# Patient Record
Sex: Male | Born: 1985 | Race: White | Hispanic: No | Smoking: Current every day smoker
Health system: Southern US, Community
[De-identification: ages and names within clinical notes are randomized; demographics above are authoritative.]

## PROBLEM LIST (undated history)

## (undated) DIAGNOSIS — J45909 Unspecified asthma, uncomplicated: Secondary | ICD-10-CM

## (undated) HISTORY — PX: OTHER SURGICAL HISTORY: SHX169

## (undated) HISTORY — PX: KNEE SURGERY: SHX244

---
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
## 2007-02-21 ENCOUNTER — Emergency Department (HOSPITAL_COMMUNITY): Admission: EM | Admit: 2007-02-21 | Discharge: 2007-02-21 | Payer: Self-pay | Admitting: Emergency Medicine

## 2014-02-08 ENCOUNTER — Emergency Department (HOSPITAL_COMMUNITY)
Admission: EM | Admit: 2014-02-08 | Discharge: 2014-02-08 | Disposition: A | Discharge status: Home / Self Care | Payer: BC Managed Care – PPO | Attending: Emergency Medicine | Admitting: Emergency Medicine

## 2014-02-08 ENCOUNTER — Encounter (HOSPITAL_COMMUNITY): Payer: Self-pay | Admitting: Emergency Medicine

## 2014-02-08 DIAGNOSIS — M545 Low back pain, unspecified: Secondary | ICD-10-CM | POA: Insufficient documentation

## 2014-02-08 DIAGNOSIS — R42 Dizziness and giddiness: Secondary | ICD-10-CM | POA: Insufficient documentation

## 2014-02-08 DIAGNOSIS — Z880 Allergy status to penicillin: Secondary | ICD-10-CM | POA: Insufficient documentation

## 2014-02-08 DIAGNOSIS — R1084 Generalized abdominal pain: Secondary | ICD-10-CM

## 2014-02-08 DIAGNOSIS — A084 Viral intestinal infection, unspecified: Secondary | ICD-10-CM

## 2014-02-08 DIAGNOSIS — A088 Other specified intestinal infections: Secondary | ICD-10-CM | POA: Diagnosis not present

## 2014-02-08 DIAGNOSIS — F17200 Nicotine dependence, unspecified, uncomplicated: Secondary | ICD-10-CM | POA: Insufficient documentation

## 2014-02-08 DIAGNOSIS — R000 Tachycardia, unspecified: Secondary | ICD-10-CM | POA: Diagnosis not present

## 2014-02-08 DIAGNOSIS — G8929 Other chronic pain: Secondary | ICD-10-CM | POA: Insufficient documentation

## 2014-02-08 DIAGNOSIS — R197 Diarrhea, unspecified: Secondary | ICD-10-CM

## 2014-02-08 DIAGNOSIS — Z79899 Other long term (current) drug therapy: Secondary | ICD-10-CM | POA: Diagnosis not present

## 2014-02-08 DIAGNOSIS — R112 Nausea with vomiting, unspecified: Secondary | ICD-10-CM | POA: Insufficient documentation

## 2014-02-08 LAB — CBC WITH DIFFERENTIAL/PLATELET
Basophils Absolute: 0 10*3/uL (ref 0.0–0.1)
Basophils Relative: 0 % (ref 0–1)
EOS ABS: 0.1 10*3/uL (ref 0.0–0.7)
Eosinophils Relative: 1 % (ref 0–5)
HCT: 47.9 % (ref 39.0–52.0)
HEMOGLOBIN: 16.6 g/dL (ref 13.0–17.0)
LYMPHS ABS: 1.7 10*3/uL (ref 0.7–4.0)
LYMPHS PCT: 21 % (ref 12–46)
MCH: 29.4 pg (ref 26.0–34.0)
MCHC: 34.7 g/dL (ref 30.0–36.0)
MCV: 84.8 fL (ref 78.0–100.0)
MONOS PCT: 9 % (ref 3–12)
Monocytes Absolute: 0.8 10*3/uL (ref 0.1–1.0)
NEUTROS ABS: 5.8 10*3/uL (ref 1.7–7.7)
NEUTROS PCT: 69 % (ref 43–77)
Platelets: 153 10*3/uL (ref 150–400)
RBC: 5.65 MIL/uL (ref 4.22–5.81)
RDW: 13.9 % (ref 11.5–15.5)
WBC: 8.3 10*3/uL (ref 4.0–10.5)

## 2014-02-08 LAB — URINALYSIS, ROUTINE W REFLEX MICROSCOPIC
BILIRUBIN URINE: NEGATIVE
Glucose, UA: NEGATIVE mg/dL
HGB URINE DIPSTICK: NEGATIVE
KETONES UR: NEGATIVE mg/dL
Leukocytes, UA: NEGATIVE
NITRITE: NEGATIVE
Protein, ur: NEGATIVE mg/dL
Specific Gravity, Urine: 1.021 (ref 1.005–1.030)
UROBILINOGEN UA: 1 mg/dL (ref 0.0–1.0)
pH: 6 (ref 5.0–8.0)

## 2014-02-08 LAB — COMPREHENSIVE METABOLIC PANEL
ALK PHOS: 53 U/L (ref 39–117)
ALT: 32 U/L (ref 0–53)
ANION GAP: 12 (ref 5–15)
AST: 16 U/L (ref 0–37)
Albumin: 3.7 g/dL (ref 3.5–5.2)
BILIRUBIN TOTAL: 1.5 mg/dL — AB (ref 0.3–1.2)
BUN: 11 mg/dL (ref 6–23)
CHLORIDE: 103 meq/L (ref 96–112)
CO2: 22 meq/L (ref 19–32)
Calcium: 8.6 mg/dL (ref 8.4–10.5)
Creatinine, Ser: 0.81 mg/dL (ref 0.50–1.35)
GLUCOSE: 96 mg/dL (ref 70–99)
POTASSIUM: 4 meq/L (ref 3.7–5.3)
Sodium: 137 mEq/L (ref 137–147)
Total Protein: 6.6 g/dL (ref 6.0–8.3)

## 2014-02-08 LAB — LIPASE, BLOOD: Lipase: 12 U/L (ref 11–59)

## 2014-02-08 MED ORDER — NAPROXEN 500 MG PO TABS: 500.0000 mg | ORAL_TABLET | Freq: Two times a day (BID) | ORAL | Status: DC | PRN

## 2014-02-08 MED ORDER — SODIUM CHLORIDE 0.9 % IV BOLUS (SEPSIS)
1000.0000 mL | Freq: Once | INTRAVENOUS | Status: AC
  Administered 2014-02-08: 1000 mL via INTRAVENOUS

## 2014-02-08 MED ORDER — MORPHINE SULFATE 4 MG/ML IJ SOLN
4.0000 mg | Freq: Once | INTRAMUSCULAR | Status: AC
  Administered 2014-02-08: 4 mg via INTRAVENOUS
  Filled 2014-02-08: qty 1

## 2014-02-08 MED ORDER — ONDANSETRON HCL 4 MG/2ML IJ SOLN
4.0000 mg | Freq: Once | INTRAMUSCULAR | Status: AC
  Administered 2014-02-08: 4 mg via INTRAVENOUS
  Filled 2014-02-08: qty 2

## 2014-02-08 MED ORDER — ONDANSETRON HCL 8 MG PO TABS: 8.0000 mg | ORAL_TABLET | Freq: Three times a day (TID) | ORAL | Status: AC | PRN

## 2014-02-08 MED ORDER — HYDROCODONE-ACETAMINOPHEN 5-325 MG PO TABS: 1.0000 | ORAL_TABLET | Freq: Four times a day (QID) | ORAL | Status: DC | PRN

## 2014-02-08 NOTE — ED Notes (Signed)
hes had n/v/d since last night. His wife and 28 year old were sick with GI virus this week

## 2014-02-08 NOTE — ED Provider Notes (Signed)
CSN: [id number]     Arrival date & time 02/08/14  1034 History   First MD Initiated Contact with Patient 02/08/14 1117     Chief Complaint  Patient presents with  . Emesis     (Consider location/radiation/quality/duration/timing/severity/associated sxs/prior Treatment) HPI Comments: Luis Rich is a 28 y.o. male with no known PMHx who presents to the ED with complaints of generalized and LUQ 6/10 crampy intermittent nonradiating abd pain and N/V/D x1 day. States his daughter and wife developed the same symptoms 2 days ago, and subsequently he developed them yesterday. States the pain was alleviated with heat and advil, and without known aggravating factors. Emesis is stomach contents and slightly bilious, but nonbloody. Diarrhea is watery/loose, unknown # of episodes, nonbloody and nonbilious. States he has an oral temp of 100.3 last night which resolved on its own PTA. Endorses associated myalgias and lightheadedness with standing, but denies any chills, HA, syncope, CP, SOB, cough, URI symptoms, diaphoresis, LAD, hematemesis, hematochezia, melena, obstipation, constipation, dysuria, hematuria, arthralgias, paresthesias, or weakness. Denies rash or color changes. No recent travel or illness, denies IV drug use. Endorses EtOH use occasionally, and everyday smoking. No prior abdominal surgeries  Patient is a 28 y.o. male presenting with abdominal pain. The history is provided by the patient. No language interpreter was used.  Abdominal Pain Pain location:  Generalized (and L upper quadrant) Pain quality: cramping   Pain radiates to:  Does not radiate Pain severity:  Moderate (6/10) Onset quality:  Gradual Duration:  1 day Timing:  Intermittent Progression:  Unchanged Chronicity:  New Context: sick contacts (daughter and wife at home with similar illness that began 2 days ago)   Context: not recent travel   Relieved by:  Heat and NSAIDs Worsened by:  Nothing tried Ineffective treatments:  None  tried Associated symptoms: diarrhea, nausea and vomiting   Associated symptoms: no anorexia, no chest pain, no chills, no constipation, no cough, no dysuria, no fever, no flatus, no hematemesis, no hematochezia, no hematuria, no melena and no shortness of breath   Diarrhea:    Quality:  Watery   Number of occurrences:  Unsure   Severity:  Moderate   Duration:  1 day   Timing:  Intermittent   Progression:  Unchanged Vomiting:    Quality:  Stomach contents   History reviewed. No pertinent past medical history. Past Surgical History  Procedure Laterality Date  . Knee surgery     History reviewed. No pertinent family history. History  Substance Use Topics  . Smoking status: Current Every Day Smoker  . Smokeless tobacco: Not on file  . Alcohol Use: Yes    Review of Systems  Constitutional: Negative for fever and chills.  Respiratory: Negative for cough and shortness of breath.   Cardiovascular: Negative for chest pain.  Gastrointestinal: Positive for nausea, vomiting, abdominal pain and diarrhea. Negative for constipation, blood in stool, melena, hematochezia, abdominal distention, rectal pain, anorexia, flatus and hematemesis.  Genitourinary: Negative for dysuria, urgency, frequency, hematuria, flank pain, decreased urine volume, discharge, penile swelling, scrotal swelling, difficulty urinating, penile pain and testicular pain.  Musculoskeletal: Positive for back pain (chronic and ongoing x2 months after trampoline injury, unchanged) and myalgias. Negative for joint swelling, neck pain and neck stiffness.  Skin: Negative for color change.  Neurological: Positive for light-headedness (with standing yesterday, now resolved). Negative for dizziness, syncope and weakness.  Hematological: Negative for adenopathy.  10 Systems reviewed and are negative for acute change except as noted in the HPI.  Allergies  Amoxicillin and Penicillins  Home Medications   Prior to Admission  medications   Medication Sig Start Date End Date Taking? Authorizing Provider  albuterol (PROVENTIL HFA;VENTOLIN HFA) 108 (90 BASE) MCG/ACT inhaler Inhale 2 puffs into the lungs every 6 (six) hours as needed for wheezing or shortness of breath.   Yes Historical Provider, MD  ibuprofen (ADVIL,MOTRIN) 200 MG tablet Take 600 mg by mouth every 6 (six) hours as needed for moderate pain.   Yes Historical Provider, MD  HYDROcodone-acetaminophen (NORCO) 5-325 MG per tablet Take 1-2 tablets by mouth every 6 (six) hours as needed for severe pain. 02/08/14   Yianna Tersigni Strupp Camprubi-Soms, PA-C  naproxen (NAPROSYN) 500 MG tablet Take 1 tablet (500 mg total) by mouth 2 (two) times daily as needed for mild pain, moderate pain or headache (TAKE WITH MEALS.). 02/08/14   Donnita Falls Camprubi-Soms, PA-C  ondansetron (ZOFRAN) 8 MG tablet Take 1 tablet (8 mg total) by mouth every 8 (eight) hours as needed for nausea or vomiting. 02/08/14   Psalm Arman Strupp Camprubi-Soms, PA-C   BP 159/92  Pulse 115  Temp(Src) 98.8 F (37.1 C) (Oral)  Resp 18  Ht  (1.727 m)  Wt 250 lb (113.399 kg)  BMI 38.02 kg/m2  SpO2 97% Physical Exam  Nursing note and vitals reviewed. Constitutional: He is oriented to person, place, and time. He appears well-developed and well-nourished.  Non-toxic appearance. No distress.  Afebrile, nontoxic, appears uncomfortable and rubbing his abdomen. Initially tachycardic  HENT:  Head: Normocephalic and atraumatic.  Mouth/Throat: Mucous membranes are dry.  Dry mucous membranes  Eyes: Conjunctivae and EOM are normal. Right eye exhibits no discharge. Left eye exhibits no discharge.  Neck: Normal range of motion. Neck supple. No spinous process tenderness and no muscular tenderness present. No rigidity. Normal range of motion present.  FROM intact without spinous process or paraspinous muscle TTP, no bony stepoffs or deformities, no muscle spasms. No rigidity or meningeal signs. No bruising or  swelling.  Cardiovascular: Regular rhythm, normal heart sounds and intact distal pulses.  Tachycardia present.  Exam reveals no gallop and no friction rub.   No murmur heard. Initially tachycardic  Pulmonary/Chest: Effort normal and breath sounds normal. No respiratory distress. He has no decreased breath sounds. He has no wheezes. He has no rhonchi. He has no rales.   He exhibits tenderness.  L posterior chest wall at rib cage margin mildly TTP  Abdominal: Soft. Normal appearance and bowel sounds are normal. He exhibits no distension. There is tenderness in the left upper quadrant. There is no rigidity, no rebound, no guarding, no CVA tenderness, no tenderness at McBurney's point and negative Murphy's sign.  Soft, nondistended, +BS throughout, with mild TTP in LUQ without r/g/r or peritoneal signs, neg Murphy's, neg McBurney's. No CVA TTP, although L posterior rib cage edge mildly TTP  Musculoskeletal: Normal range of motion.  MAE x4, gait WNL  Neurological: He is alert and oriented to person, place, and time.  Skin: Skin is warm, dry and intact. No rash noted. No pallor.  Psychiatric: He has a normal mood and affect.    ED Course  Procedures (including critical care time) Labs Review Labs Reviewed  COMPREHENSIVE METABOLIC PANEL - Abnormal; Notable for the following:    Total Bilirubin 1.5 (*)    All other components within normal limits  CBC WITH DIFFERENTIAL  URINALYSIS, ROUTINE W REFLEX MICROSCOPIC  LIPASE, BLOOD    Imaging Review No results found.   EKG Interpretation  None      MDM   Final diagnoses:  Nausea vomiting and diarrhea  Viral gastroenteritis  Generalized abdominal cramping    27y/o male with GI upset after wife and daughter became ill with similar symptoms, began 1 day ago. Tachycardic but afebrile, appears dry, given 1 bolus of fluids with resolution of tachycardia. Will await labs, U/A, and give 1 more bolus of fluids, as well as morphine and zofran.  Will reassess after labs result.  2:30 PM Pt just now providing urine. Pt feeling greatly improved after 2 boluses, zofran, and morphine. CBC WNL, CMP showing mildly elevated bili at 1.5 but otherwise WNL, doubt any hepatic or biliary tract dysfunction. Doubt need for CT, but will assess after urine, given that he's had vague L CVA pain x2 months, which hasn't changed, but could potentially be related. Will reassess after urine results  4:39 PM U/A unremarkable. L CVA pain likely musculoskeletal, which has become aggravating with vomiting. Pt tolerating PO here, feels improved, I believe this is a viral illness given pt stable and afebrile. Tachycardia resolved after 2 boluses, and VSS during stay. Doubt any other emergent condition requiring further work up. Will give rx for zofran, norco, and naprosyn with f/up at PCP in 1 wk. Discussed rest, hydration, and BRAT diet. I explained the diagnosis and have given explicit precautions to return to the ER including for any other new or worsening symptoms. The patient understands and accepts the medical plan as it's been dictated and I have answered their questions. Discharge instructions concerning home care and prescriptions have been given. The patient is STABLE and is discharged to home in good condition.  BP 132/74  Pulse 82  Temp(Src) 98.8 F (37.1 C) (Oral)  Resp 18  Ht  (1.727 m)  Wt 250 lb (113.399 kg)  BMI 38.02 kg/m2  SpO2 97%  Meds ordered this encounter  Medications  . sodium chloride 0.9 % bolus 1,000 mL    Sig:   . morphine 4 MG/ML injection 4 mg    Sig:   . ondansetron (ZOFRAN) injection 4 mg    Sig:   . sodium chloride 0.9 % bolus 1,000 mL    Sig:   . HYDROcodone-acetaminophen (NORCO) 5-325 MG per tablet    Sig: Take 1-2 tablets by mouth every 6 (six) hours as needed for severe pain.    Dispense:  10 tablet    Refill:  0    Order Specific Question:  Supervising Provider    Answer:  Eber Hong D [[id number]]  . naproxen  (NAPROSYN) 500 MG tablet    Sig: Take 1 tablet (500 mg total) by mouth 2 (two) times daily as needed for mild pain, moderate pain or headache (TAKE WITH MEALS.).    Dispense:  20 tablet    Refill:  0    Order Specific Question:  Supervising Provider    Answer:  Eber Hong D [[id number]]  . ondansetron (ZOFRAN) 8 MG tablet    Sig: Take 1 tablet (8 mg total) by mouth every 8 (eight) hours as needed for nausea or vomiting.    Dispense:  20 tablet    Refill:  0    Order Specific Question:  Supervising Provider    Answer:  Vida Roller [address] Camprubi-Soms, PA-C 02/08/14 1642

## 2014-02-08 NOTE — ED Notes (Signed)
Pt placed in room and placed in gown and on cardiac monitor.

## 2014-02-08 NOTE — Discharge Instructions (Signed)
Use zofran as prescribed, as needed for nausea. Use naprosyn and norco as needed for pain but don't drive or operate machinery while using norco, and always take naprosyn with food. Stay well hydrated with small sips of fluids (water/gatorade) throughout the day. Follow a BRAT (banana-rice-applesauce-toast) diet as described below for the next 24-48 hours. The 'BRAT' diet is suggested, then progress to diet as tolerated as symptoms abate. Call if bloody stools, persistent diarrhea, vomiting, fever or abdominal pain. Return to ER for changing or worsening of symptoms.  Food Choices to Help Relieve Diarrhea When you have diarrhea, the foods you eat and your eating habits are very important. Choosing the right foods and drinks can help relieve diarrhea. Also, because diarrhea can last up to 7 days, you need to replace lost fluids and electrolytes (such as sodium, potassium, and chloride) in order to help prevent dehydration.  WHAT GENERAL GUIDELINES DO I NEED TO FOLLOW?  Slowly drink 1 cup (8 oz) of fluid for each episode of diarrhea. If you are getting enough fluid, your urine will be clear or pale yellow.  Eat starchy foods. Some good choices include white rice, white toast, pasta, low-fiber cereal, baked potatoes (without the skin), saltine crackers, and bagels.  Avoid large servings of any cooked vegetables.  Limit fruit to two servings per day. A serving is  cup or 1 small piece.  Choose foods with less than 2 g of fiber per serving.  Limit fats to less than 8 tsp (38 g) per day.  Avoid fried foods.  Eat foods that have probiotics in them. Probiotics can be found in certain dairy products.  Avoid foods and beverages that may increase the speed at which food moves through the stomach and intestines (gastrointestinal tract). Things to avoid include:  High-fiber foods, such as dried fruit, raw fruits and vegetables, nuts, seeds, and whole grain foods.  Spicy foods and high-fat  foods.  Foods and beverages sweetened with high-fructose corn syrup, honey, or sugar alcohols such as xylitol, sorbitol, and mannitol. WHAT FOODS ARE RECOMMENDED? Grains White rice. White, [place], or pita breads (fresh or toasted), including plain rolls, buns, or bagels. White pasta. Saltine, soda, or graham crackers. Pretzels. Low-fiber cereal. Cooked cereals made with water (such as cornmeal, farina, or cream cereals). Plain muffins. Matzo. Melba toast. Zwieback.  Vegetables Potatoes (without the skin). Strained tomato and vegetable juices. Most well-cooked and canned vegetables without seeds. Tender lettuce. Fruits Cooked or canned applesauce, apricots, cherries, fruit cocktail, grapefruit, peaches, pears, or plums. Fresh bananas, apples without skin, cherries, grapes, cantaloupe, grapefruit, peaches, oranges, or plums.  Meat and Other Protein Products Baked or boiled chicken. Eggs. Tofu. Fish. Seafood. Smooth peanut butter. Ground or well-cooked tender beef, ham, veal, lamb, pork, or poultry.  Dairy Plain yogurt, kefir, and unsweetened liquid yogurt. Lactose-free milk, buttermilk, or soy milk. Plain hard cheese. Beverages Sport drinks. Clear broths. Diluted fruit juices (except prune). Regular, caffeine-free sodas such as ginger ale. Water. Decaffeinated teas. Oral rehydration solutions. Sugar-free beverages not sweetened with sugar alcohols. Other Bouillon, broth, or soups made from recommended foods.  The items listed above may not be a complete list of recommended foods or beverages. Contact your dietitian for more options. WHAT FOODS ARE NOT RECOMMENDED? Grains Whole grain, whole wheat, bran, or rye breads, rolls, pastas, crackers, and cereals. Wild or brown rice. Cereals that contain more than 2 g of fiber per serving. Corn tortillas or taco shells. Cooked or dry oatmeal. Granola. Popcorn. Vegetables Raw vegetables.  Cabbage, broccoli, Brussels sprouts, artichokes, baked beans, beet  greens, corn, kale, legumes, peas, sweet potatoes, and yams. Potato skins. Cooked spinach and cabbage. Fruits Dried fruit, including raisins and dates. Raw fruits. Stewed or dried prunes. Fresh apples with skin, apricots, mangoes, pears, raspberries, and strawberries.  Meat and Other Protein Products Chunky peanut butter. Nuts and seeds. Beans and lentils. Berniece Salines.  Dairy High-fat cheeses. Milk, chocolate milk, and beverages made with milk, such as milk shakes. Cream. Ice cream. Sweets and Desserts Sweet rolls, doughnuts, and sweet breads. Pancakes and waffles. Fats and Oils Butter. Cream sauces. Margarine. Salad oils. Plain salad dressings. Olives. Avocados.  Beverages Caffeinated beverages (such as coffee, tea, soda, or energy drinks). Alcoholic beverages. Fruit juices with pulp. Prune juice. Soft drinks sweetened with high-fructose corn syrup or sugar alcohols. Other Coconut. Hot sauce. Chili powder. Mayonnaise. Gravy. Cream-based or milk-based soups.  The items listed above may not be a complete list of foods and beverages to avoid. Contact your dietitian for more information. WHAT SHOULD I DO IF I BECOME DEHYDRATED? Diarrhea can sometimes lead to dehydration. Signs of dehydration include dark urine and dry mouth and skin. If you think you are dehydrated, you should rehydrate with an oral rehydration solution. These solutions can be purchased at pharmacies, retail stores, or online.  Drink -1 cup (120-240 mL) of oral rehydration solution each time you have an episode of diarrhea. If drinking this amount makes your diarrhea worse, try drinking smaller amounts more often. For example, drink 1-3 tsp (5-15 mL) every 5-10 minutes.  A general rule for staying hydrated is to drink 1-2 L of fluid per day. Talk to your health care provider about the specific amount you should be drinking each day. Drink enough fluids to keep your urine clear or pale yellow. Document Released: 08/13/2003 Document  Revised: 05/28/2013 Document Reviewed: 04/15/2013 [facility] Patient Information 2015 [place], [place]. This information is not intended to replace advice given to you by your health care provider. Make sure you discuss any questions you have with your health care provider.   Abdominal Pain Many things can cause belly (abdominal) pain. Most times, the belly pain is not dangerous. Many cases of belly pain can be watched and treated at home. HOME CARE   Do not take medicines that help you go poop (laxatives) unless told to by your doctor.  Only take medicine as told by your doctor.  Eat or drink as told by your doctor. Your doctor will tell you if you should be on a special diet. GET HELP IF:  You do not know what is causing your belly pain.  You have belly pain while you are sick to your stomach (nauseous) or have runny poop (diarrhea).  You have pain while you pee or poop.  Your belly pain wakes you up at night.  You have belly pain that gets worse or better when you eat.  You have belly pain that gets worse when you eat fatty foods.  You have a fever. GET HELP RIGHT AWAY IF:   The pain does not go away within 2 hours.  You keep throwing up (vomiting).  The pain changes and is only in the right or left part of the belly.  You have bloody or tarry looking poop. MAKE SURE YOU:   Understand these instructions.  Will watch your condition.  Will get help right away if you are not doing well or get worse. Document Released: 11/09/2007 Document Revised: 05/28/2013 Document Reviewed: 01/30/2013 [employer]  Patient Information 2015 [place], [place]. This information is not intended to replace advice given to you by your health care provider. Make sure you discuss any questions you have with your health care provider.  Diarrhea Diarrhea is watery poop (stool). It can make you feel weak, tired, thirsty, or give you a dry mouth (signs of dehydration). Watery poop is a sign of another  problem, most often an infection. It often lasts 2-3 days. It can last longer if it is a sign of something serious. Take care of yourself as told by your doctor. HOME CARE   Drink 1 cup (8 ounces) of fluid each time you have watery poop.  Do not drink the following fluids:  Those that contain simple sugars (fructose, glucose, galactose, lactose, sucrose, maltose).  Sports drinks.  Fruit juices.  Whole milk products.  Sodas.  Drinks with caffeine (coffee, tea, soda) or alcohol.  Oral rehydration solution may be used if the doctor says it is okay. You may make your own solution. Follow this recipe:   - teaspoon table salt.   teaspoon baking soda.   teaspoon salt substitute containing potassium chloride.  1 tablespoons sugar.  1 liter (34 ounces) of water.  Avoid the following foods:  High fiber foods, such as raw fruits and vegetables.  Nuts, seeds, and whole grain breads and cereals.   Those that are sweetened with sugar alcohols (xylitol, sorbitol, mannitol).  Try eating the following foods:  Starchy foods, such as rice, toast, pasta, low-sugar cereal, oatmeal, baked potatoes, crackers, and bagels.  Bananas.  Applesauce.  Eat probiotic-rich foods, such as yogurt and milk products that are fermented.  Wash your hands well after each time you have watery poop.  Only take medicine as told by your doctor.  Take a warm bath to help lessen burning or pain from having watery poop. GET HELP RIGHT AWAY IF:   You cannot drink fluids without throwing up (vomiting).  You keep throwing up.  You have blood in your poop, or your poop looks black and tarry.  You do not pee (urinate) in 6-8 hours, or there is only a small amount of very dark pee.  You have belly (abdominal) pain that gets worse or stays in the same spot (localizes).  You are weak, dizzy, confused, or light-headed.  You have a very bad headache.  Your watery poop gets worse or does not get  better.  You have a fever or lasting symptoms for more than 2-3 days.  You have a fever and your symptoms suddenly get worse. MAKE SURE YOU:   Understand these instructions.  Will watch your condition.  Will get help right away if you are not doing well or get worse. Document Released: 11/09/2007 Document Revised: 10/07/2013 Document Reviewed: 01/29/2012 [facility] Patient Information 2015 [place], [place]. This information is not intended to replace advice given to you by your health care provider. Make sure you discuss any questions you have with your health care provider.  Nausea and Vomiting Nausea means you feel sick to your stomach. Throwing up (vomiting) is a reflex where stomach contents come out of your mouth. HOME CARE   Take medicine as told by your doctor.  Do not force yourself to eat. However, you do need to drink fluids.  If you feel like eating, eat a normal diet as told by your doctor.  Eat rice, wheat, potatoes, bread, lean meats, yogurt, fruits, and vegetables.  Avoid high-fat foods.  Drink enough fluids to keep your  pee (urine) clear or pale yellow.  Ask your doctor how to replace body fluid losses (rehydrate). Signs of body fluid loss (dehydration) include:  Feeling very thirsty.  Dry lips and mouth.  Feeling dizzy.  Dark pee.  Peeing less than normal.  Feeling confused.  Fast breathing or heart rate. GET HELP RIGHT AWAY IF:   You have blood in your throw up.  You have black or bloody poop (stool).  You have a bad headache or stiff neck.  You feel confused.  You have bad belly (abdominal) pain.  You have chest pain or trouble breathing.  You do not pee at least once every 8 hours.  You have cold, clammy skin.  You keep throwing up after 24 to 48 hours.  You have a fever. MAKE SURE YOU:   Understand these instructions.  Will watch your condition.  Will get help right away if you are not doing well or get worse. Document  Released: 11/09/2007 Document Revised: 08/15/2011 Document Reviewed: 10/22/2010 [facility] Patient Information 2015 [place], [place]. This information is not intended to replace advice given to you by your health care provider. Make sure you discuss any questions you have with your health care provider.  Viral Gastroenteritis Viral gastroenteritis is also called stomach flu. This illness is caused by a certain type of germ (virus). It can cause sudden watery poop (diarrhea) and throwing up (vomiting). This can cause you to lose body fluids (dehydration). This illness usually lasts for 3 to 8 days. It usually goes away on its own. HOME CARE   Drink enough fluids to keep your pee (urine) clear or pale yellow. Drink small amounts of fluids often.  Ask your doctor how to replace body fluid losses (rehydration).  Avoid:  Foods high in sugar.  Alcohol.  Bubbly (carbonated) drinks.  Tobacco.  Juice.  Caffeine drinks.  Very hot or cold fluids.  Fatty, greasy foods.  Eating too much at one time.  Dairy products until 24 to 48 hours after your watery poop stops.  You may eat foods with active cultures (probiotics). They can be found in some yogurts and supplements.  Wash your hands well to avoid spreading the illness.  Only take medicines as told by your doctor. Do not give aspirin to children. Do not take medicines for watery poop (antidiarrheals).  Ask your doctor if you should keep taking your regular medicines.  Keep all doctor visits as told. GET HELP RIGHT AWAY IF:   You cannot keep fluids down.  You do not pee at least once every 6 to 8 hours.  You are short of breath.  You see blood in your poop or throw up. This may look like coffee grounds.  You have belly (abdominal) pain that gets worse or is just in one small spot (localized).  You keep throwing up or having watery poop.  You have a fever.  The patient is a child younger than 3 months, and he or she has a  fever.  The patient is a child older than 3 months, and he or she has a fever and problems that do not go away.  The patient is a child older than 3 months, and he or she has a fever and problems that suddenly get worse.  The patient is a baby, and he or she has no tears when crying. MAKE SURE YOU:   Understand these instructions.  Will watch your condition.  Will get help right away if you are not doing  well or get worse. Document Released: 11/09/2007 Document Revised: 08/15/2011 Document Reviewed: 03/09/2011 [facility] Patient Information 2015 [place], [place]. This information is not intended to replace advice given to you by your health care provider. Make sure you discuss any questions you have with your health care provider.

## 2014-02-08 NOTE — ED Notes (Signed)
Pt states he is feeling better. The cramps went away but his lower back is hurting from sitting on the stretcher.

## 2014-02-11 NOTE — ED Provider Notes (Signed)
Medical screening examination/treatment/procedure(s) were performed by non-physician practitioner and as supervising physician I was immediately available for consultation/collaboration.   EKG Interpretation None       Arby Barrette, MD 02/11/14 1112

## 2015-06-09 ENCOUNTER — Emergency Department (HOSPITAL_COMMUNITY)
Admission: EM | Admit: 2015-06-09 | Discharge: 2015-06-09 | Disposition: A | Discharge status: Home / Self Care | Payer: 59 | Attending: Emergency Medicine | Admitting: Emergency Medicine

## 2015-06-09 ENCOUNTER — Encounter (HOSPITAL_COMMUNITY): Payer: Self-pay | Admitting: Emergency Medicine

## 2015-06-09 ENCOUNTER — Emergency Department (HOSPITAL_COMMUNITY): Payer: 59

## 2015-06-09 DIAGNOSIS — F17210 Nicotine dependence, cigarettes, uncomplicated: Secondary | ICD-10-CM | POA: Insufficient documentation

## 2015-06-09 DIAGNOSIS — Y998 Other external cause status: Secondary | ICD-10-CM | POA: Diagnosis not present

## 2015-06-09 DIAGNOSIS — X58XXXA Exposure to other specified factors, initial encounter: Secondary | ICD-10-CM | POA: Diagnosis not present

## 2015-06-09 DIAGNOSIS — G8929 Other chronic pain: Secondary | ICD-10-CM | POA: Diagnosis not present

## 2015-06-09 DIAGNOSIS — M545 Low back pain, unspecified: Secondary | ICD-10-CM

## 2015-06-09 DIAGNOSIS — S3992XA Unspecified injury of lower back, initial encounter: Secondary | ICD-10-CM | POA: Insufficient documentation

## 2015-06-09 DIAGNOSIS — Z79899 Other long term (current) drug therapy: Secondary | ICD-10-CM | POA: Diagnosis not present

## 2015-06-09 DIAGNOSIS — Y9289 Other specified places as the place of occurrence of the external cause: Secondary | ICD-10-CM | POA: Diagnosis not present

## 2015-06-09 DIAGNOSIS — Z880 Allergy status to penicillin: Secondary | ICD-10-CM | POA: Diagnosis not present

## 2015-06-09 DIAGNOSIS — Y9372 Activity, wrestling: Secondary | ICD-10-CM | POA: Diagnosis not present

## 2015-06-09 LAB — URINALYSIS, ROUTINE W REFLEX MICROSCOPIC
Bilirubin Urine: NEGATIVE
GLUCOSE, UA: NEGATIVE mg/dL
HGB URINE DIPSTICK: NEGATIVE
Ketones, ur: NEGATIVE mg/dL
Leukocytes, UA: NEGATIVE
NITRITE: NEGATIVE
PH: 5 (ref 5.0–8.0)
PROTEIN: NEGATIVE mg/dL
SPECIFIC GRAVITY, URINE: 1.018 (ref 1.005–1.030)

## 2015-06-09 MED ORDER — OXYCODONE-ACETAMINOPHEN 5-325 MG PO TABS: 1.0000 | ORAL_TABLET | Freq: Three times a day (TID) | ORAL | Status: AC | PRN

## 2015-06-09 MED ORDER — KETOROLAC TROMETHAMINE 60 MG/2ML IM SOLN
60.0000 mg | Freq: Once | INTRAMUSCULAR | Status: AC
  Administered 2015-06-09: 60 mg via INTRAMUSCULAR
  Filled 2015-06-09: qty 2

## 2015-06-09 MED ORDER — PREDNISONE 20 MG PO TABS
60.0000 mg | ORAL_TABLET | Freq: Once | ORAL | Status: AC
  Administered 2015-06-09: 60 mg via ORAL
  Filled 2015-06-09: qty 3

## 2015-06-09 MED ORDER — OXYCODONE-ACETAMINOPHEN 5-325 MG PO TABS
2.0000 | ORAL_TABLET | Freq: Once | ORAL | Status: AC
  Administered 2015-06-09: 2 via ORAL
  Filled 2015-06-09: qty 2

## 2015-06-09 MED ORDER — MORPHINE SULFATE (PF) 4 MG/ML IV SOLN
4.0000 mg | Freq: Once | INTRAVENOUS | Status: AC
  Administered 2015-06-09: 4 mg via INTRAMUSCULAR
  Filled 2015-06-09: qty 1

## 2015-06-09 MED ORDER — IBUPROFEN 600 MG PO TABS: 600.0000 mg | ORAL_TABLET | Freq: Four times a day (QID) | ORAL | Status: AC | PRN

## 2015-06-09 NOTE — Discharge Instructions (Signed)
You were evaluated in the ED today for your back pain. There does not appear to be an emergent cause for your symptoms at this time. Your exam was reassuring. 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 You may take your Motrin for mild to moderate pain in the oxycodone for severe/breakthrough pain. Do not take this medication before driving as a can make you drowsy. Follow-up with your doctor or neurology for further evaluation and management of your symptoms. Return to ED for any new or worsening symptoms.  Back Pain, Adult Back pain is very common in adults.The cause of back pain is rarely dangerous and the pain often gets better over time.The cause of your back pain may not be known. Some common causes of back pain include:  Strain of the muscles or ligaments supporting the spine.  Wear and tear (degeneration) of the spinal disks.  Arthritis.  Direct injury to the back. For many people, back pain may return. Since back pain is rarely dangerous, most people can learn to manage this condition on their own. HOME CARE INSTRUCTIONS Watch your back pain for any changes. The following actions may help to lessen any discomfort you are feeling:  Remain active. It is stressful on your back to sit or stand in one place for long periods of time. Do not sit, drive, or stand in one place for more than 30 minutes at a time. Take short walks on even surfaces as soon as you are able.Try to increase the length of time you walk each day.  Exercise regularly as directed by your health care provider. Exercise helps your back heal faster. It also helps avoid future injury by keeping your muscles strong and flexible.  Do not stay in bed.Resting more than 1-2 days can delay your recovery.  Pay attention to your body when you bend and lift. The most comfortable positions are those that put less stress on your recovering back. Always use proper lifting techniques,  including:  Bending your knees.  Keeping the load close to your body.  Avoiding twisting.  Find a comfortable position to sleep. Use a firm mattress and lie on your side with your knees slightly bent. If you lie on your back, put a pillow under your knees.  Avoid feeling anxious or stressed.Stress increases muscle tension and can worsen back pain.It is important to recognize when you are anxious or stressed and learn ways to manage it, such as with exercise.  Take medicines only as directed by your health care provider. Over-the-counter medicines to reduce pain and inflammation are often the most helpful.Your health care provider may prescribe muscle relaxant drugs.These medicines help dull your pain so you can more quickly return to your normal activities and healthy exercise.  Apply ice to the injured area:  Put ice in a plastic bag.  Place a towel between your skin and the bag.  Leave the ice on for 20 minutes, 2-3 times a day for the first 2-3 days. After that, ice and heat may be alternated to reduce pain and spasms.  Maintain a healthy weight. Excess weight puts extra stress on your back and makes it difficult to maintain good posture. SEEK MEDICAL CARE IF:  You have pain that is not relieved with rest or medicine.  You have increasing pain going down into the legs or buttocks.  You have pain that does not improve in one week.  You have night pain.  You lose weight.  You have a fever or chills. SEEK IMMEDIATE MEDICAL CARE IF:   You develop new bowel or bladder control problems.  You have unusual weakness or numbness in your arms or legs.  You develop nausea or vomiting.  You develop abdominal pain.  You feel faint.   This information is not intended to replace advice given to you by your health care provider. Make sure you discuss any questions you have with your health care provider.   Document Released: 05/23/2005 Document Revised: 06/13/2014 Document  Reviewed: 09/24/2013 [employer] Interactive Patient Education [employer].

## 2015-06-09 NOTE — ED Provider Notes (Signed)
CSN: [id number]     Arrival date & time 06/09/15  0900 History   First MD Initiated Contact with Patient 06/09/15 [phone]     Chief Complaint  Patient presents with  . Back Pain     (Consider location/radiation/quality/duration/timing/severity/associated sxs/prior Treatment) HPI Luis Rich is a 30 y.o. male who is otherwise healthy comes in for evaluation of back pain. Patient reports he has had low back pain over the past 4 months or so after an injury on a trampoline. 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 He reports associated numbness in his left leg. He also reports difficulties urinating and when he is able to urinate it is very painful and described as "sharp". He also reports painful bowel movements, nonbloody or overtly dark. Also reports bilateral, dull testicular pain. No penile discharge, concern for STI. Reports monogamous relationship. Nothing immediately tried for current back pain. Movement worsens the discomfort, rest makes it tolerable. Pain is moderate in ED. No other modifying factors.  History reviewed. No pertinent past medical history. Past Surgical History  Procedure Laterality Date  . Knee surgery    . Chronic back pain     No family history on file. Social History  Substance Use Topics  . Smoking status: Current Every Day Smoker -- 0.50 packs/day    Types: Cigarettes  . Smokeless tobacco: None  . Alcohol Use: Yes     Comment: socially    Review of Systems A 10 point review of systems was completed and was negative except for pertinent positives and negatives as mentioned in the history of present illness     Allergies  Amoxicillin and Penicillins  Home Medications   Prior to Admission medications   Medication Sig Start Date End Date Taking? Authorizing Provider  HYDROcodone-acetaminophen (NORCO) 5-325 MG per  tablet Take 1-2 tablets by mouth every 6 (six) hours as needed for severe pain. Patient not taking: Reported on 06/09/2015 02/08/14   Mercedes Camprubi-Soms, PA-C  ibuprofen (ADVIL,MOTRIN) 600 MG tablet Take 1 tablet (600 mg total) by mouth every 6 (six) hours as needed. 06/09/15   Joycie Peek, PA-C  naproxen (NAPROSYN) 500 MG tablet Take 1 tablet (500 mg total) by mouth 2 (two) times daily as needed for mild pain, moderate pain or headache (TAKE WITH MEALS.). Patient not taking: Reported on 06/09/2015 02/08/14   Mercedes Camprubi-Soms, PA-C  ondansetron (ZOFRAN) 8 MG tablet Take 1 tablet (8 mg total) by mouth every 8 (eight) hours as needed for nausea or vomiting. Patient not taking: Reported on 06/09/2015 02/08/14   Mercedes Camprubi-Soms, PA-C  oxyCODONE-acetaminophen (PERCOCET/ROXICET) 5-325 MG tablet Take 1-2 tablets by mouth every 8 (eight) hours as needed for severe pain. 06/09/15   Divante Kotch, PA-C   BP 144/90 mmHg  Pulse 92  Temp(Src) 98.5 F (36.9 C) (Oral)  Resp 20  SpO2 100% Physical Exam  Constitutional: He is oriented to person, place, and time. He appears well-developed and well-nourished.  HENT:  Head: Normocephalic and atraumatic.  Mouth/Throat: Oropharynx is clear and moist.  Eyes: Conjunctivae are normal. Pupils are equal, round, and reactive to light. Right eye exhibits no discharge. Left eye exhibits no discharge. No scleral icterus.  Neck: Neck supple.  Cardiovascular: Normal rate, regular rhythm and normal heart sounds.   Pulmonary/Chest: Effort normal and breath sounds normal. No respiratory distress. He has no wheezes.  He has no rales.  Abdominal: Soft. There is no tenderness.  Genitourinary:  Unremarkable male genital exam. No testicular or epididymal tenderness. No scrotal swelling, erythema. No inguinal lymphadenopathy. No other lesions or deformities.  Musculoskeletal: He exhibits no tenderness.  Neurological: He is alert and oriented to person, place, and time.   Cranial Nerves II-XII grossly intact. Moves all extremities without ataxia. Motor strength appears to be intact, although slightly weaker on left leg due to pain. Sensation is intact to light touch.  Skin: Skin is warm and dry. No rash noted.  Psychiatric: He has a normal mood and affect.  Nursing note and vitals reviewed.   ED Course  Procedures (including critical care time) Labs Review Labs Reviewed  URINALYSIS, ROUTINE W REFLEX MICROSCOPIC (NOT AT [facility])  GC/CHLAMYDIA PROBE AMP ([place]) NOT AT [facility]    Imaging Review Mr Thoracic Spine Wo Contrast  06/09/2015  CLINICAL DATA:  Low back pain EXAM: MRI THORACIC AND LUMBAR SPINE WITHOUT CONTRAST TECHNIQUE: Multiplanar and multiecho pulse sequences of the thoracic and lumbar spine were obtained without intravenous contrast. COMPARISON:  None. FINDINGS: MR THORACIC SPINE FINDINGS Image quality degraded by mild motion Normal thoracic alignment.  Negative for fracture or mass. Negative for cord compression. No cord lesion identified. Suboptimal evaluation of the cord due to motion. Disc degeneration Schmorl's nodes from T8 through L1. Multilevel mild facet degeneration throughout the thoracic spine. Significant disc pathology is described below. Small central disc protrusion at T5-6 with mild facet degeneration. Mild spinal stenosis. Small disc protrusion T7-8 with upgoing disc material behind the T7 vertebral body. Mild facet degeneration and mild spinal stenosis. No cord deformity. No other disc protrusion or significant spinal stenosis. MR LUMBAR SPINE FINDINGS Image quality degraded by mild motion. Normal alignment. Negative for fracture or mass lesion. Conus medullaris normal and terminates at L1. L1-2:  Negative L2-3:  Negative L3-4:  Negative L4-5: Moderately large left paracentral disc protrusion with flattening of the thecal sac and moderate spinal stenosis. Mild facet hypertrophy bilaterally. Neural foramina widely patent L5-S1: Small  central disc protrusion and osteophyte without significant stenosis or neural impingement. IMPRESSION: MR THORACIC SPINE IMPRESSION Multilevel disc and facet degeneration throughout the thoracic spine. Small disc protrusions at T5-6 and T7-8 with mild spinal stenosis MR LUMBAR SPINE IMPRESSION Moderately large left paracentral disc protrusion at L4-5 causing moderate spinal stenosis. Small central disc protrusion and osteophyte L5-S1 without neural impingement. Electronically Signed   By: Marlan Palau M.D.   On: 06/09/2015 13:24   Mr Lumbar Spine Wo Contrast  06/09/2015  CLINICAL DATA:  Low back pain EXAM: MRI THORACIC AND LUMBAR SPINE WITHOUT CONTRAST TECHNIQUE: Multiplanar and multiecho pulse sequences of the thoracic and lumbar spine were obtained without intravenous contrast. COMPARISON:  None. FINDINGS: MR THORACIC SPINE FINDINGS Image quality degraded by mild motion Normal thoracic alignment.  Negative for fracture or mass. Negative for cord compression. No cord lesion identified. Suboptimal evaluation of the cord due to motion. Disc degeneration Schmorl's nodes from T8 through L1. Multilevel mild facet degeneration throughout the thoracic spine. Significant disc pathology is described below. Small central disc protrusion at T5-6 with mild facet degeneration. Mild spinal stenosis. Small disc protrusion T7-8 with upgoing disc material behind the T7 vertebral body. Mild facet degeneration and mild spinal stenosis. No cord deformity. No other disc protrusion or significant spinal stenosis. MR LUMBAR SPINE FINDINGS Image quality degraded by mild motion. Normal alignment. Negative for fracture or mass lesion. Conus medullaris normal and terminates at  L1. L1-2:  Negative L2-3:  Negative L3-4:  Negative L4-5: Moderately large left paracentral disc protrusion with flattening of the thecal sac and moderate spinal stenosis. Mild facet hypertrophy bilaterally. Neural foramina widely patent L5-S1: Small central disc  protrusion and osteophyte without significant stenosis or neural impingement. IMPRESSION: MR THORACIC SPINE IMPRESSION Multilevel disc and facet degeneration throughout the thoracic spine. Small disc protrusions at T5-6 and T7-8 with mild spinal stenosis MR LUMBAR SPINE IMPRESSION Moderately large left paracentral disc protrusion at L4-5 causing moderate spinal stenosis. Small central disc protrusion and osteophyte L5-S1 without neural impingement. Electronically Signed   By: Marlan Palauharles  Clark M.D.   On: 06/09/2015 13:24   I have personally reviewed and evaluated these images and lab results as part of my medical decision-making.   EKG Interpretation None       Meds given in ED:  Medications  ketorolac (TORADOL) injection 60 mg (60 mg Intramuscular Given 06/09/15 1007)  oxyCODONE-acetaminophen (PERCOCET/ROXICET) 5-325 MG per tablet 2 tablet (2 tablets Oral Given 06/09/15 1007)  predniSONE (DELTASONE) tablet 60 mg (60 mg Oral Given 06/09/15 1006)  morphine 4 MG/ML injection 4 mg (4 mg Intramuscular Given 06/09/15 1350)    Discharge Medication List as of 06/09/2015  2:32 PM    START taking these medications   Details  oxyCODONE-acetaminophen (PERCOCET/ROXICET) 5-325 MG tablet Take 1-2 tablets by mouth every 8 (eight) hours as needed for severe pain., Starting 06/09/2015, Until Discontinued, Print       Filed Vitals:   06/09/15 1115 06/09/15 1340 06/09/15 1345 06/09/15 1415  BP: 151/89 135/94 156/88 144/90  Pulse: 84 90 92 92  Temp:      TempSrc:      Resp:   20   SpO2: 97% 95% 95% 100%    Postvoid residual 41 mL via a bladder scan. MDM  Luis Rich is a 30 y.o. male who comes in for evaluation of low back pain after injury wrestling. Patient ambulated in the ED. His postvoid residual was 41 mL. On arrival, he is hemodynamically stable with slightly elevated blood pressure, afebrile. Nonfocal neuro exam. No evidence of urinary tract infection, however will obtain GC chlamydia cultures as patient  does admit to some dysuria and painful bowel movements/rectal pain. Previous rectal exam performed in the ED today by my colleague, Joy PA-C, without any prostate abnormalities. MRI of thoracic and lumbar spine without any acute or emergent findings. There is multilevel disc and facet degeneration with mild spinal stenosis, moderately large left paracentral disc protrusion at L4-5 causing moderate spinal stenosis. Plan to treat pain and outpatient follow-up outpatient with neurology. No evidence of cauda equina, central cord compression, conus medullaris syndrome or other acute or emergent spinal cord pathology. Discussed with my attending, Dr. Cyndie ChimeNguyen who agrees with above plan. Final diagnoses:  Lumbar back pain      Joycie PeekBenjamin Kazuko Clemence, PA-C 06/09/15 1500  Leta BaptistEmily Roe Nguyen, MD 06/13/15 [phone]

## 2015-06-09 NOTE — ED Provider Notes (Signed)
CSN: [id number]     Arrival date & time 06/09/15  0900 History  By signing my name below, I, Tanda Rockers, attest that this documentation has been prepared under the direction and in the presence of Shawn Joy, PA-C. Electronically Signed: Tanda Rockers, ED Scribe. 06/09/2015. 9:51 AM.   Chief Complaint  Patient presents with  . Back Pain    The history is provided by the patient. No language interpreter was used.     HPI Comments: Luis Rich is a 30 y.o. male who presents to the Emergency Department complaining of sudden onset, constant, severe, 10/10, stabbing/cramping, left lower back pain radiating into left buttocks x 3 days. Pt reports that he was wrestling with his wife when he began having the pain. He mentions hx of intermittent similar back pain x 1.5 years after jumping on a trampoline and landing on his buttocks directly. Pt was followed by 2 different physicians for his pain and received multiple cortisone injections. He also received 5 mg Percocet, a course of Prednisone, and has tried stretching and applying heat and ice without relief. Pt was managing his pain for the past couple of months on his own. He reports that the pain he is currently having is much worse than his usual back pain. He mentions new onset of numbness to his left lower leg and toes as well as tingling to his right hand. He also complains of difficulty hesitancy with urination. Denies urinary or bowel incontinence, saddle anesthesia, weakness, tingling, or any other associated symptoms.   History reviewed. No pertinent past medical history. Past Surgical History  Procedure Laterality Date  . Knee surgery    . Chronic back pain     No family history on file. Social History  Substance Use Topics  . Smoking status: Current Every Day Smoker -- 0.50 packs/day    Types: Cigarettes  . Smokeless tobacco: None  . Alcohol Use: Yes     Comment: socially    Review of Systems  Gastrointestinal:       Negative for  bowel incontinence  Genitourinary: Positive for difficulty urinating.       Negative for urinary incontinence  Musculoskeletal: Positive for back pain and arthralgias.  Neurological: Positive for numbness. Negative for weakness.       + Paresthesias to right hand  All other systems reviewed and are negative.  Allergies  Amoxicillin and Penicillins  Home Medications   Prior to Admission medications   Medication Sig Start Date End Date Taking? Authorizing Provider  HYDROcodone-acetaminophen (NORCO) 5-325 MG per tablet Take 1-2 tablets by mouth every 6 (six) hours as needed for severe pain. Patient not taking: Reported on 06/09/2015 02/08/14   Mercedes Camprubi-Soms, PA-C  ibuprofen (ADVIL,MOTRIN) 600 MG tablet Take 1 tablet (600 mg total) by mouth every 6 (six) hours as needed. 06/09/15   Joycie Peek, PA-C  naproxen (NAPROSYN) 500 MG tablet Take 1 tablet (500 mg total) by mouth 2 (two) times daily as needed for mild pain, moderate pain or headache (TAKE WITH MEALS.). Patient not taking: Reported on 06/09/2015 02/08/14   Mercedes Camprubi-Soms, PA-C  ondansetron (ZOFRAN) 8 MG tablet Take 1 tablet (8 mg total) by mouth every 8 (eight) hours as needed for nausea or vomiting. Patient not taking: Reported on 06/09/2015 02/08/14   Mercedes Camprubi-Soms, PA-C  oxyCODONE-acetaminophen (PERCOCET/ROXICET) 5-325 MG tablet Take 1-2 tablets by mouth every 8 (eight) hours as needed for severe pain. 06/09/15   Joycie Peek, PA-C   Triage Vitals: BP  178/119 mmHg  Pulse 93  Temp(Src) 98.5 F (36.9 C) (Oral)  Resp 20  SpO2 96%   Physical Exam  Constitutional: He is oriented to person, place, and time. He appears well-developed and well-nourished. No distress.  HENT:  Head: Normocephalic and atraumatic.  Eyes: Conjunctivae and EOM are normal. Pupils are equal, round, and reactive to light.  Neck: Normal range of motion. Neck supple.  Cardiovascular: Normal rate, regular rhythm, normal heart sounds and  intact distal pulses.   Pulmonary/Chest: Effort normal and breath sounds normal. No respiratory distress.  Abdominal: Soft.  Genitourinary:  Decreased rectal tone.  Musculoskeletal: Normal range of motion. He exhibits tenderness.  Radicular pain to left leg and toes. Left lumbar musculature tenderness. No paraspinal tenderness. Range of motion in the spine limited by pain. Full range of motion in the extremities.  Neurological: He is alert and oriented to person, place, and time. He has normal reflexes.  Numbness and tingling to the left leg and toes. No sensory other deficits. Strength 5/5 in all extremities, but weaker in the left foot. Hesitant, limping gait. Coordination intact. Cranial nerves III-XII grossly intact. No facial droop.   Skin: Skin is warm and dry.  Psychiatric: He has a normal mood and affect. His behavior is normal.  Nursing note and vitals reviewed.   ED Course  Procedures (including critical care time)  DIAGNOSTIC STUDIES: Oxygen Saturation is 96% on RA, normal by my interpretation.    COORDINATION OF CARE: 9:50 AM-Discussed treatment plan which includes consulting attending physician with pt at bedside and pt agreed to plan.   Labs Review Labs Reviewed  URINALYSIS, ROUTINE W REFLEX MICROSCOPIC (NOT AT [facility])  GC/CHLAMYDIA PROBE AMP ([place]) NOT AT [facility]    Imaging Review Mr Thoracic Spine Wo Contrast  06/09/2015  CLINICAL DATA:  Low back pain EXAM: MRI THORACIC AND LUMBAR SPINE WITHOUT CONTRAST TECHNIQUE: Multiplanar and multiecho pulse sequences of the thoracic and lumbar spine were obtained without intravenous contrast. COMPARISON:  None. FINDINGS: MR THORACIC SPINE FINDINGS Image quality degraded by mild motion Normal thoracic alignment.  Negative for fracture or mass. Negative for cord compression. No cord lesion identified. Suboptimal evaluation of the cord due to motion. Disc degeneration Schmorl's nodes from T8 through L1. Multilevel mild facet  degeneration throughout the thoracic spine. Significant disc pathology is described below. Small central disc protrusion at T5-6 with mild facet degeneration. Mild spinal stenosis. Small disc protrusion T7-8 with upgoing disc material behind the T7 vertebral body. Mild facet degeneration and mild spinal stenosis. No cord deformity. No other disc protrusion or significant spinal stenosis. MR LUMBAR SPINE FINDINGS Image quality degraded by mild motion. Normal alignment. Negative for fracture or mass lesion. Conus medullaris normal and terminates at L1. L1-2:  Negative L2-3:  Negative L3-4:  Negative L4-5: Moderately large left paracentral disc protrusion with flattening of the thecal sac and moderate spinal stenosis. Mild facet hypertrophy bilaterally. Neural foramina widely patent L5-S1: Small central disc protrusion and osteophyte without significant stenosis or neural impingement. IMPRESSION: MR THORACIC SPINE IMPRESSION Multilevel disc and facet degeneration throughout the thoracic spine. Small disc protrusions at T5-6 and T7-8 with mild spinal stenosis MR LUMBAR SPINE IMPRESSION Moderately large left paracentral disc protrusion at L4-5 causing moderate spinal stenosis. Small central disc protrusion and osteophyte L5-S1 without neural impingement. Electronically Signed   By: Marlan Palauharles  Clark M.D.   On: 06/09/2015 13:24   Mr Lumbar Spine Wo Contrast  06/09/2015  CLINICAL DATA:  Low back pain EXAM:  MRI THORACIC AND LUMBAR SPINE WITHOUT CONTRAST TECHNIQUE: Multiplanar and multiecho pulse sequences of the thoracic and lumbar spine were obtained without intravenous contrast. COMPARISON:  None. FINDINGS: MR THORACIC SPINE FINDINGS Image quality degraded by mild motion Normal thoracic alignment.  Negative for fracture or mass. Negative for cord compression. No cord lesion identified. Suboptimal evaluation of the cord due to motion. Disc degeneration Schmorl's nodes from T8 through L1. Multilevel mild facet degeneration  throughout the thoracic spine. Significant disc pathology is described below. Small central disc protrusion at T5-6 with mild facet degeneration. Mild spinal stenosis. Small disc protrusion T7-8 with upgoing disc material behind the T7 vertebral body. Mild facet degeneration and mild spinal stenosis. No cord deformity. No other disc protrusion or significant spinal stenosis. MR LUMBAR SPINE FINDINGS Image quality degraded by mild motion. Normal alignment. Negative for fracture or mass lesion. Conus medullaris normal and terminates at L1. L1-2:  Negative L2-3:  Negative L3-4:  Negative L4-5: Moderately large left paracentral disc protrusion with flattening of the thecal sac and moderate spinal stenosis. Mild facet hypertrophy bilaterally. Neural foramina widely patent L5-S1: Small central disc protrusion and osteophyte without significant stenosis or neural impingement. IMPRESSION: MR THORACIC SPINE IMPRESSION Multilevel disc and facet degeneration throughout the thoracic spine. Small disc protrusions at T5-6 and T7-8 with mild spinal stenosis MR LUMBAR SPINE IMPRESSION Moderately large left paracentral disc protrusion at L4-5 causing moderate spinal stenosis. Small central disc protrusion and osteophyte L5-S1 without neural impingement. Electronically Signed   By: Marlan Palau M.D.   On: 06/09/2015 13:24     EKG Interpretation None      MDM   Final diagnoses:  Lumbar back pain   Luis Rich presents with lower back pain radiating into his left leg for the last 3 days.  Findings and plan of care discussed with Leta Baptist, MD.  Patient endorses urinary retention since his acute on chronic injury. This, combined with the patient's complaint of back pain and leg numbness, gives concern for a much more serious issue, with early cauda equina on the differential. Also endorses radicular symptoms. MRI, bladder scan, and rectal tone testing indicated due to this patient's presentation. Patient does  endorse walking, but with great difficulty. Patient transferred to Pod A and patient care handoff report given to Joycie Peek, PA-C, who agreed to carry on patient care.   On an unrelated note, patient will need to be followed up with by PCP for his possible hypertension. Patient was asymptomatic to hypertension here in the ED.  I personally performed the services described in this documentation, which was scribed in my presence. The recorded information has been reviewed and is accurate.     Anselm Pancoast, PA-C 06/09/15 1607  Leta Baptist, MD 06/13/15 [phone]

## 2015-06-09 NOTE — ED Notes (Addendum)
Patient states he hurt his lower back when jumping on trampoline x 1 1/2 years ago.   Patient has had chronic back pain since.   Patient states has been flaring up in the last month with L side sciatic pain.   Patient complains of urinary stream problems, has throbbing in testicles, tingling in fingers.

## 2015-06-09 NOTE — ED Notes (Signed)
See PA assessment 

## 2015-06-09 NOTE — ED Notes (Signed)
Pt still in MRI 

## 2015-06-09 NOTE — ED Notes (Signed)
PA informed of patient B/P.

## 2015-06-10 LAB — GC/CHLAMYDIA PROBE AMP (~~LOC~~) NOT AT ARMC
CHLAMYDIA, DNA PROBE: NEGATIVE
NEISSERIA GONORRHEA: NEGATIVE

## 2017-09-20 ENCOUNTER — Emergency Department (HOSPITAL_COMMUNITY): Payer: 59

## 2017-09-20 ENCOUNTER — Encounter (HOSPITAL_COMMUNITY): Payer: Self-pay

## 2017-09-20 ENCOUNTER — Other Ambulatory Visit: Payer: Self-pay

## 2017-09-20 ENCOUNTER — Emergency Department (HOSPITAL_COMMUNITY)
Admission: EM | Admit: 2017-09-20 | Discharge: 2017-09-20 | Disposition: A | Discharge status: Home / Self Care | Payer: 59 | Attending: Emergency Medicine | Admitting: Emergency Medicine

## 2017-09-20 DIAGNOSIS — M25562 Pain in left knee: Secondary | ICD-10-CM | POA: Diagnosis not present

## 2017-09-20 DIAGNOSIS — J45990 Exercise induced bronchospasm: Secondary | ICD-10-CM | POA: Insufficient documentation

## 2017-09-20 DIAGNOSIS — F17210 Nicotine dependence, cigarettes, uncomplicated: Secondary | ICD-10-CM | POA: Diagnosis not present

## 2017-09-20 HISTORY — DX: Unspecified asthma, uncomplicated: J45.909

## 2017-09-20 MED ORDER — TRAMADOL HCL 50 MG PO TABS: 50.0000 mg | ORAL_TABLET | Freq: Four times a day (QID) | ORAL | 0 refills | Status: AC | PRN

## 2017-09-20 NOTE — ED Triage Notes (Signed)
Pt endorses left knee pain after bumping his knee at work on Monday. Pt has hx of knee surgery on same knee.

## 2017-09-20 NOTE — Discharge Instructions (Signed)
Please read attached information. If you experience any new or worsening signs or symptoms please return to the emergency room for evaluation. Please follow-up with your primary care provider or specialist as discussed. Please use medication prescribed only as directed and discontinue taking if you have any concerning signs or symptoms.   °

## 2017-09-20 NOTE — ED Notes (Signed)
Pt verbalized understanding of d/c instructions and has no further questions, VSS, NAD.  

## 2017-09-20 NOTE — ED Provider Notes (Signed)
[facility]GENC[facility] Provider Note   CSN: [id number] Arrival date & time: 09/20/17  1216     History   Chief Complaint Chief Complaint  Patient presents with  . Knee Pain    HPI Candelaria StagersRyan Latouche is a 32 y.o. male.  HPI   32 year old male presents today with complaints of left knee pain.  Patient notes a significant past medical history of significant derangement of his left knee requiring surgery with ACL and MCL and meniscal tear.  Patient notes that this was approximately 15 years ago.  Patient notes occasional pain in the knee nonsevere.  He notes that 2 days ago he bumped his knee on a table with no significant pain.  He notes that following night he was doing extra work and was on his hands and knees for approximately an hour.  He notes pain thereafter that worsened overnight.  He notes significant swelling around the knee yesterday which is improved today.  Patient notes he has been using heat pads at home that have improved his symptoms.  She denies any lower extremity swelling, no fever noted.  Past Medical History:  Diagnosis Date  . Asthma    exercised induced    There are no active problems to display for this patient.   Past Surgical History:  Procedure Laterality Date  . chronic back pain    . KNEE SURGERY          Home Medications    Prior to Admission medications   Medication Sig Start Date End Date Taking? Authorizing Provider  HYDROcodone-acetaminophen (NORCO) 5-325 MG per tablet Take 1-2 tablets by mouth every 6 (six) hours as needed for severe pain. Patient not taking: Reported on 06/09/2015 02/08/14   Street, [place], PA-C  ibuprofen (ADVIL,MOTRIN) 600 MG tablet Take 1 tablet (600 mg total) by mouth every 6 (six) hours as needed. 06/09/15   Cartner, Sharlet SalinaBenjamin, PA-C  naproxen (NAPROSYN) 500 MG tablet Take 1 tablet (500 mg total) by mouth 2 (two) times daily as needed for mild pain, moderate pain or headache (TAKE WITH  MEALS.). Patient not taking: Reported on 06/09/2015 02/08/14   Street, [place]Mercedes, PA-C  ondansetron (ZOFRAN) 8 MG tablet Take 1 tablet (8 mg total) by mouth every 8 (eight) hours as needed for nausea or vomiting. Patient not taking: Reported on 06/09/2015 02/08/14   Street, [place]cedes, [place] JerseyPA-C  oxyCODONE-acetaminophen (PERCOCET/ROXICET) 5-325 MG tablet Take 1-2 tablets by mouth every 8 (eight) hours as needed for severe pain. 06/09/15   Cartner, Sharlet SalinaBenjamin, PA-C  traMADol (ULTRAM) 50 MG tablet Take 1 tablet (50 mg total) by mouth every 6 (six) hours as needed. 09/20/17   Eyvonne MechanicHedges, Javar Eshbach, PA-C    Family History History reviewed. No pertinent family history.  Social History Social History   Tobacco Use  . Smoking status: Current Every Day Smoker    Packs/day: 0.50    Types: Cigarettes  Substance Use Topics  . Alcohol use: Yes    Comment: socially  . Drug use: No     Allergies   Amoxicillin and Penicillins   Review of Systems Review of Systems  All other systems reviewed and are negative.    Physical Exam Updated Vital Signs BP 134/90 (BP Location: Left Arm)   Pulse 94   Temp 98 F (36.7 C) (Oral)   Resp 16   Ht 5\' 8"  (1.727 m)   Wt 95.3 kg (210 lb)   SpO2 97%   BMI 31.93 kg/m   Physical Exam  Constitutional: He is oriented to person, place, and time. He appears well-developed and well-nourished.  HENT:  Head: Normocephalic and atraumatic.  Eyes: Pupils are equal, round, and reactive to light. Conjunctivae are normal. Right eye exhibits no discharge. Left eye exhibits no discharge. No scleral icterus.  Neck: Normal range of motion. No JVD present. No tracheal deviation present.  Pulmonary/Chest: Effort normal. No stridor.  Musculoskeletal:  Left knee atraumatic no swelling, redness or warmth to touch, significant tenderness palpation of the left anterior and lateral joint line, pain with palpation of the LCL-limited range of motion secondary to pain, 90 degrees of flexion noted-no  significant laxity, difficult exam due to patient's pain  Neurological: He is alert and oriented to person, place, and time. Coordination normal.  Psychiatric: He has a normal mood and affect. His behavior is normal. Judgment and thought content normal.  Nursing note and vitals reviewed.    ED Treatments / Results  Labs (all labs ordered are listed, but only abnormal results are displayed) Labs Reviewed - No data to display  EKG None  Radiology Dg Knee Complete 4 Views Left  Result Date: 09/20/2017 CLINICAL DATA:  32 year old male status post work injury yesterday. Posterior and lateral pain. EXAM: LEFT KNEE - COMPLETE 4+ VIEW COMPARISON:  None. FINDINGS: Small to moderate suprapatellar joint effusion is visible. The patella is intact. Bone mineralization is within normal limits. Small chronic appearing cortical irregularity along the medial left proximal tibia metadiaphysis might be of prior hardware removal site, uncertain. Mild medial compartment joint space loss and degenerative spurring. No acute osseous abnormality identified. IMPRESSION: Joint effusion with no acute osseous abnormality identified. Electronically Signed   By: Odessa Fleming M.D.   On: 09/20/2017 13:21    Procedures Procedures (including critical care time)  Medications Ordered in ED Medications - No data to display   Initial Impression / Assessment and Plan / ED Course  I have reviewed the triage vital signs and the nursing notes.  Pertinent labs & imaging results that were available during my care of the patient were reviewed by me and considered in my medical decision making (see chart for details).     Final Clinical Impressions(s) / ED Diagnoses   Final diagnoses:  Acute pain of left knee   Labs:   Imaging: DG Knee complete left  Consults:  Therapeutics:  Discharge Meds:   Assessment/Plan: 51 YOM presents today with complaints of knee pain.  Patient with a likely knee sprain he has a small joint  effusion noted on x-ray, no signs of infectious etiology low suspicion for septic arthritis.  I wrapped patient's knee with an Ace wrap, he was given crutches, he will follow-up as an outpatient with orthopedics and return immediately with any new or worsening signs or symptoms.  Patient verbalized understanding and agreement to today's plan had no further questions or concerns at the time of discharge.   ED Discharge Orders        Ordered    traMADol (ULTRAM) 50 MG tablet  Every 6 hours PRN     09/20/17 1409       Eyvonne Mechanic, PA-C 09/20/17 1411    Donnetta Hutching, MD 09/21/17 [phone]

## 2018-02-01 ENCOUNTER — Encounter (HOSPITAL_COMMUNITY): Payer: Self-pay | Admitting: *Deleted

## 2018-02-01 ENCOUNTER — Emergency Department (HOSPITAL_COMMUNITY)
Admission: EM | Admit: 2018-02-01 | Discharge: 2018-02-01 | Disposition: A | Discharge status: Home / Self Care | Payer: 59 | Attending: Emergency Medicine | Admitting: Emergency Medicine

## 2018-02-01 ENCOUNTER — Emergency Department (HOSPITAL_COMMUNITY): Payer: 59

## 2018-02-01 DIAGNOSIS — J45909 Unspecified asthma, uncomplicated: Secondary | ICD-10-CM | POA: Diagnosis not present

## 2018-02-01 DIAGNOSIS — M5417 Radiculopathy, lumbosacral region: Secondary | ICD-10-CM | POA: Diagnosis not present

## 2018-02-01 DIAGNOSIS — F17210 Nicotine dependence, cigarettes, uncomplicated: Secondary | ICD-10-CM | POA: Insufficient documentation

## 2018-02-01 DIAGNOSIS — N50812 Left testicular pain: Secondary | ICD-10-CM | POA: Insufficient documentation

## 2018-02-01 DIAGNOSIS — M545 Low back pain: Secondary | ICD-10-CM | POA: Diagnosis present

## 2018-02-01 LAB — URINALYSIS, ROUTINE W REFLEX MICROSCOPIC
BILIRUBIN URINE: NEGATIVE
GLUCOSE, UA: NEGATIVE mg/dL
Hgb urine dipstick: NEGATIVE
KETONES UR: NEGATIVE mg/dL
LEUKOCYTES UA: NEGATIVE
NITRITE: NEGATIVE
PH: 5 (ref 5.0–8.0)
PROTEIN: NEGATIVE mg/dL
Specific Gravity, Urine: 1.025 (ref 1.005–1.030)

## 2018-02-01 MED ORDER — HYDROCODONE-ACETAMINOPHEN 5-325 MG PO TABS: 1.0000 | ORAL_TABLET | Freq: Four times a day (QID) | ORAL | 0 refills | Status: AC | PRN

## 2018-02-01 MED ORDER — CYCLOBENZAPRINE HCL 10 MG PO TABS: 10.0000 mg | ORAL_TABLET | Freq: Two times a day (BID) | ORAL | 0 refills | Status: AC | PRN

## 2018-02-01 MED ORDER — NAPROXEN 500 MG PO TABS: 500.0000 mg | ORAL_TABLET | Freq: Two times a day (BID) | ORAL | 0 refills | Status: AC

## 2018-02-01 MED ORDER — KETOROLAC TROMETHAMINE 30 MG/ML IJ SOLN
30.0000 mg | Freq: Once | INTRAMUSCULAR | Status: AC
  Administered 2018-02-01: 30 mg via INTRAMUSCULAR
  Filled 2018-02-01: qty 1

## 2018-02-01 MED ORDER — PREDNISONE 50 MG PO TABS: 50.0000 mg | ORAL_TABLET | Freq: Every day | ORAL | 0 refills | Status: AC

## 2018-02-01 MED ORDER — DEXAMETHASONE SODIUM PHOSPHATE 10 MG/ML IJ SOLN
10.0000 mg | Freq: Once | INTRAMUSCULAR | Status: AC
  Administered 2018-02-01: 10 mg via INTRAMUSCULAR
  Filled 2018-02-01: qty 1

## 2018-02-01 MED ORDER — HYDROMORPHONE HCL 1 MG/ML IJ SOLN
1.0000 mg | Freq: Once | INTRAMUSCULAR | Status: AC
  Administered 2018-02-01: 1 mg via INTRAMUSCULAR
  Filled 2018-02-01: qty 1

## 2018-02-01 NOTE — ED Triage Notes (Signed)
Pt in c/o lower back pain that radiates down his left leg for the last two months, history of spinal stenosis and back surgery two years ago, Sunday pain increased and started noticing swelling to his left lower back on Monday, swelling has also started to right lower back but is worse on left side, also c/o left testicular pain since Monday morning

## 2018-02-01 NOTE — ED Provider Notes (Signed)
[facility] EMERGENCY DEPARTMENT Provider Note   CSN: [id number] Arrival date & time: 02/01/18  4034     History   Chief Complaint Chief Complaint  Patient presents with  . Back Pain    HPI Luis Rich is a 32 y.o. male.  HPI Luis Rich is a 32 y.o. male with hx of spinal stenosis with prior lumbar diskectomy,  presents to ED with complaint of back pain for the last 4 days. States went fishing 4 days ago and tripped and fell with no symptoms at that time. States that evening developed lower back pain that radiates into left leg. Taking ibuprofen and tylenol for pain at home with no improvement in pain. Denies iv drug use. Denies new numbness or weakness. Denies saddle paresthesia. No trouble controlling bladder or bowels.   Pt admits to left testicular pain as well. No new sexual partners. No erectile dysfunction. No urinary symptoms.   Past Medical History:  Diagnosis Date  . Asthma    exercised induced    There are no active problems to display for this patient.   Past Surgical History:  Procedure Laterality Date  . chronic back pain    . KNEE SURGERY          Home Medications    Prior to Admission medications   Medication Sig Start Date End Date Taking? Authorizing Provider  HYDROcodone-acetaminophen (NORCO) 5-325 MG per tablet Take 1-2 tablets by mouth every 6 (six) hours as needed for severe pain. Patient not taking: Reported on 06/09/2015 02/08/14   Street, [place], PA-C  ibuprofen (ADVIL,MOTRIN) 600 MG tablet Take 1 tablet (600 mg total) by mouth every 6 (six) hours as needed. 06/09/15   Cartner, Sharlet Salina, PA-C  naproxen (NAPROSYN) 500 MG tablet Take 1 tablet (500 mg total) by mouth 2 (two) times daily as needed for mild pain, moderate pain or headache (TAKE WITH MEALS.). Patient not taking: Reported on 06/09/2015 02/08/14   Street, [place], PA-C  ondansetron (ZOFRAN) 8 MG tablet Take 1 tablet (8 mg total) by mouth every 8 (eight) hours as needed for  nausea or vomiting. Patient not taking: Reported on 06/09/2015 02/08/14   Street, [place], [place]  oxyCODONE-acetaminophen (PERCOCET/ROXICET) 5-325 MG tablet Take 1-2 tablets by mouth every 8 (eight) hours as needed for severe pain. 06/09/15   Cartner, Sharlet Salina, PA-C  traMADol (ULTRAM) 50 MG tablet Take 1 tablet (50 mg total) by mouth every 6 (six) hours as needed. 09/20/17   Eyvonne Mechanic, PA-C    Family History History reviewed. No pertinent family history.  Social History Social History   Tobacco Use  . Smoking status: Current Every Day Smoker    Packs/day: 0.50    Types: Cigarettes  Substance Use Topics  . Alcohol use: Yes    Comment: socially  . Drug use: No     Allergies   Amoxicillin and Penicillins   Review of Systems Review of Systems  Constitutional: Negative for chills and fever.  Respiratory: Negative for cough, chest tightness and shortness of breath.   Cardiovascular: Negative for chest pain, palpitations and leg swelling.  Gastrointestinal: Negative for abdominal distention, abdominal pain, diarrhea, nausea and vomiting.  Genitourinary: Positive for testicular pain. Negative for dysuria, frequency, hematuria, penile swelling, scrotal swelling and urgency.  Musculoskeletal: Positive for arthralgias and back pain. Negative for myalgias, neck pain and neck stiffness.  Skin: Negative for rash.  Allergic/Immunologic: Negative for immunocompromised state.  Neurological: Negative for dizziness, weakness, light-headedness, numbness and headaches.  All  other systems reviewed and are negative.    Physical Exam Updated Vital Signs BP (!) 153/89 (BP Location: Right Arm)   Pulse 83   Temp 98 F (36.7 C) (Oral)   Resp 20   SpO2 100%   Physical Exam  Constitutional: He appears well-developed and well-nourished. No distress.  HENT:  Head: Normocephalic.  Neck: Normal range of motion. Neck supple.  Cardiovascular: Normal rate, regular rhythm and normal heart sounds.    Pulmonary/Chest: Effort normal and breath sounds normal. No respiratory distress. He has no wheezes. He has no rales.  Abdominal: Soft. There is no tenderness.  Genitourinary:  Genitourinary Comments: Normal external genitalia.  No obvious swelling or deformity.  Cremasteric reflex present.  Tenderness to palpation over left testicle with no palpable masses or swelling.  Musculoskeletal:  Tenderness to palpation around his incision to the lumbar spine.  There is no obvious swelling.  Pain with left straight leg raise.  Neurological:  5/5 and equal lower extremity strength. 2+ and equal patellar reflexes bilaterally. Pt able to dorsiflex bilateral toes and feet with good strength against resistance. Equal sensation bilaterally over thighs and lower legs.   Skin: Skin is warm and dry.  Nursing note and vitals reviewed.    ED Treatments / Results  Labs (all labs ordered are listed, but only abnormal results are displayed) Labs Reviewed  URINALYSIS, ROUTINE W REFLEX MICROSCOPIC  GC/CHLAMYDIA PROBE AMP ([place]    EKG None  Radiology No results found.  Procedures Procedures (including critical care time)  Medications Ordered in ED Medications  ketorolac (TORADOL) 30 MG/ML injection 30 mg (30 mg Intramuscular Given 02/01/18 1153)  dexamethasone (DECADRON) injection 10 mg (10 mg Intramuscular Given 02/01/18 1153)  HYDROmorphone (DILAUDID) injection 1 mg (1 mg Intramuscular Given 02/01/18 1346)     Initial Impression / Assessment and Plan / ED Course  I have reviewed the triage vital signs and the nursing notes.  Pertinent labs & imaging results that were available during my care of the patient were reviewed by me and considered in my medical decision making (see chart for details).    Patient emergency department 4 days of lower back pain with radiation to the left testicle and left leg.  Patient states the symptoms are exactly the same as when he was diagnosed  with a herniated disc and had to have surgery which was performed by Dr. Conchita ParisNundkumar.  This was 2 years ago.  He does have tenderness in the scrotum on the exam, I did order ultrasound scrotal which he refused.  Patient states he knows for fact that this is coming from his back.  He is neurovascularly intact, his reflexes are present, he has no weakness, sensation intact.  He is not having any trouble controlling his bowels or bladder.  He did have some urinary issues and we did a postvoid residual which did show that he is able to empty bladder normally.  I did send urine analysis which came back normal.    Patient was explained that MRI is not indicated at this time will treat his symptoms and have him follow-up with his neurosurgeon.  Patient became very upset.  He grabbed his phone out of his pocket and started calling the office of Dr. Conchita ParisNundkumar.  Unfortunately they were not able to connect him with him and told him that they will get back to him.  Patient is requesting MRI.  I explained to him that MRI is not indicated right  now since he has not tried any initial conservative treatments.  I did explain to him that may be he will need an MRI in the future if his back pain does not improve.  He did request that a try to get in touch with his neurosurgeon.  I spoke with the physician assistant who works with Dr. Conchita Paris.  He agreed that MRI is not indicated at this time.  We will place patient on steroids, NSAIDs, muscle relaxants and will give him Norco for severe pain.  He does appear to be in a lot of pain here and requesting pain medications.  Initially gave him Toradol which did not help, I did give him a Dilaudid IM.  I explained plan to the patient and his mother who is here with the patient and will take him home.  They agreed.  I will send him home with steroids, Flexeril, pain medications.  Explained to him that he will need to call the office to get an appointment for next week to be  seen.   Vitals:   02/01/18 0925 02/01/18 1438  BP: (!) 153/89 (!) 147/85  Pulse: 83 74  Resp: 20 20  Temp: 98 F (36.7 C)   TempSrc: Oral   SpO2: 100% 98%     Final Clinical Impressions(s) / ED Diagnoses   Final diagnoses:  Testicular pain, left  Lumbosacral radiculopathy    ED Discharge Orders         Ordered    predniSONE (DELTASONE) 50 MG tablet  Daily     02/01/18 1429    HYDROcodone-acetaminophen (NORCO) 5-325 MG tablet  Every 6 hours PRN     02/01/18 1429    naproxen (NAPROSYN) 500 MG tablet  2 times daily     02/01/18 1429    cyclobenzaprine (FLEXERIL) 10 MG tablet  2 times daily PRN     02/01/18 1429           Jaynie Crumble, PA-C 02/01/18 1558    Arby Barrette, MD 02/02/18 [phone]

## 2018-02-01 NOTE — ED Notes (Signed)
ED Provider at bedside. 

## 2018-02-01 NOTE — ED Notes (Signed)
Pt and mother state that the patient will be given a ride by the mother to go home. Pain meds ordered by Lemont Fillersatyana, PA-C.

## 2018-02-01 NOTE — ED Notes (Signed)
Post void residual volume . Tatyana PA-C made aware.

## 2018-02-01 NOTE — Discharge Instructions (Signed)
Take prednisone as prescribed until all gone.  Take naproxen for inflammation.  Norco for severe pain.  Take Flexeril for spasms.  Try heating pads, lidocaine patches.  Try stretching.  Please call and follow-up with Dr. Conchita ParisNundkumar next week in the office.  Return if any worsening symptoms.

## 2018-02-02 LAB — GC/CHLAMYDIA PROBE AMP (~~LOC~~) NOT AT ARMC
Chlamydia: NEGATIVE
Neisseria Gonorrhea: NEGATIVE

## 2018-02-14 ENCOUNTER — Other Ambulatory Visit: Payer: Self-pay | Admitting: Physician Assistant

## 2018-02-14 DIAGNOSIS — M5442 Lumbago with sciatica, left side: Principal | ICD-10-CM

## 2018-02-14 DIAGNOSIS — G8929 Other chronic pain: Secondary | ICD-10-CM

## 2018-02-15 ENCOUNTER — Ambulatory Visit
Admission: RE | Admit: 2018-02-15 | Discharge: 2018-02-15 | Disposition: A | Discharge status: Home / Self Care | Payer: 59 | Source: Ambulatory Visit | Attending: Physician Assistant | Admitting: Physician Assistant

## 2018-02-15 DIAGNOSIS — G8929 Other chronic pain: Secondary | ICD-10-CM

## 2018-02-15 DIAGNOSIS — M5442 Lumbago with sciatica, left side: Principal | ICD-10-CM

## 2018-02-15 MED ORDER — GADOBENATE DIMEGLUMINE 529 MG/ML IV SOLN
20.0000 mL | Freq: Once | INTRAVENOUS | Status: AC | PRN
  Administered 2018-02-15: 20 mL via INTRAVENOUS

## 2020-05-05 IMAGING — MR MR LUMBAR SPINE WO/W CM
4 of 7 series · 28 of 48 positions shown · IV contrast (multihance)
Comparison: 06/09/2015

CLINICAL DATA: Chronic left-sided low back pain with left sciatica

EXAM:
MRI LUMBAR SPINE WITHOUT AND WITH CONTRAST
TECHNIQUE: Multiplanar and multiecho pulse sequences of the lumbar spine were
obtained without and with intravenous contrast.
CONTRAST:  20mL MULTIHANCE GADOBENATE DIMEGLUMINE 529 MG/ML IV SOLN

[Series 3: T2 post-contrast · sagittal · 4.0mm · 0.55mm/px · 3 of 14 slices shown]
[im 1/14]
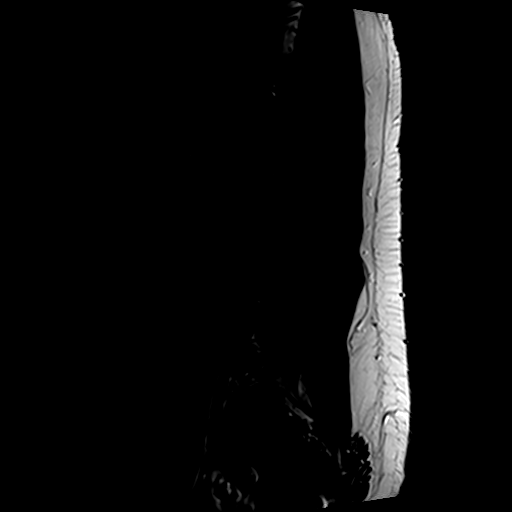
[im 7/14]
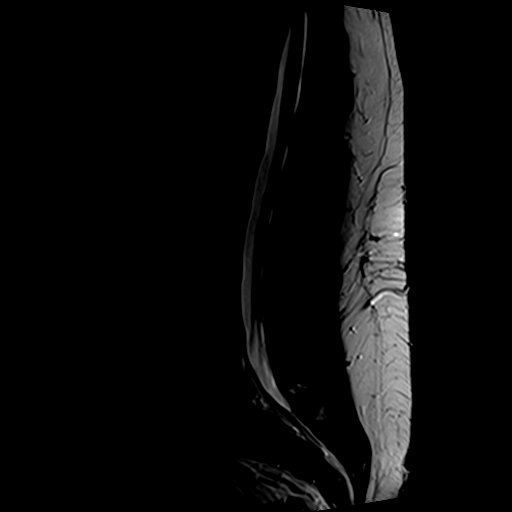
[im 14/14]
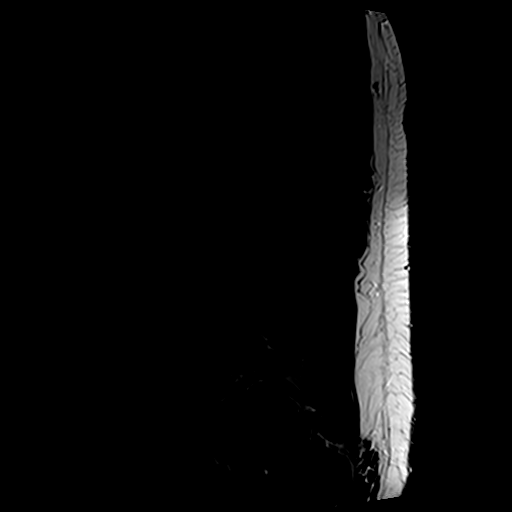

[Series 5: T1 · sagittal · 4.0mm · 0.55mm/px · 4 of 14 slices shown (1 of 2)]
[im 1/14]
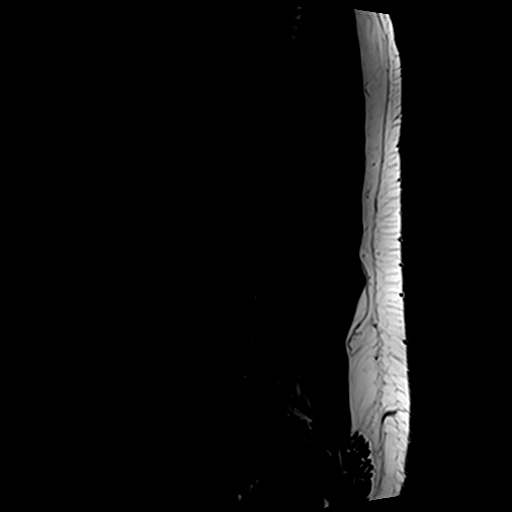
[im 5/14]
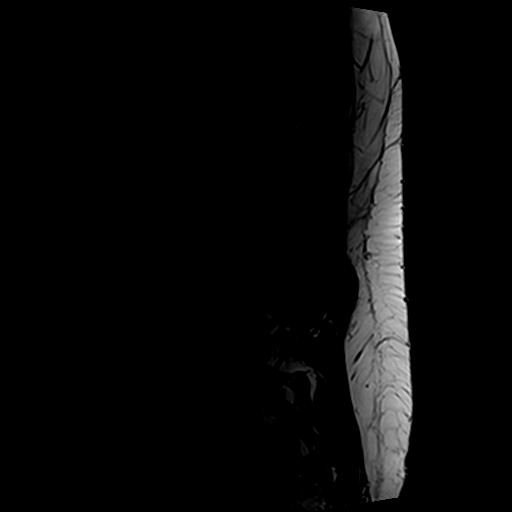
[im 9/14]
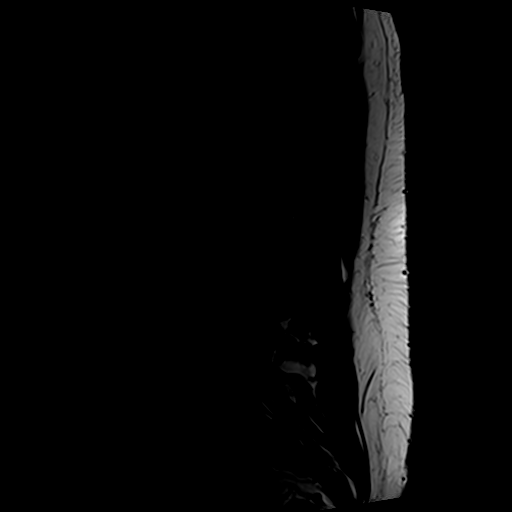
[im 14/14]
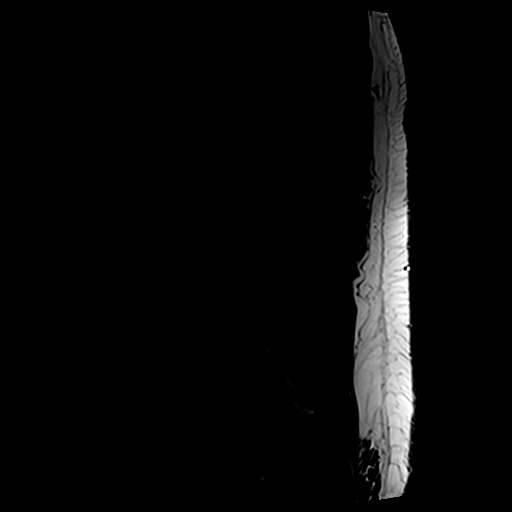

[Series 6: T1 · axial · 4.0mm · 0.37mm/px · z∈[-141,+49]mm · 10 of 37 slices shown (2 of 2)]
[im 1/37]
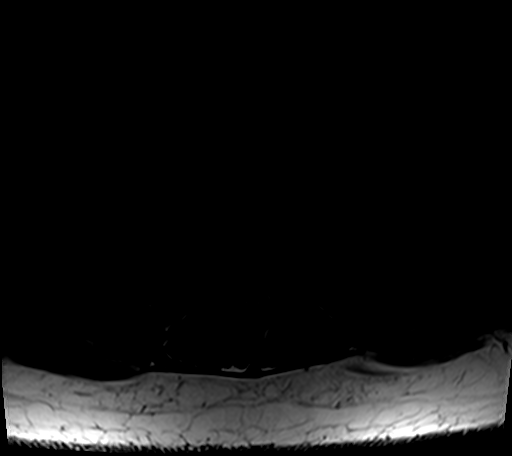
[im 4/37]
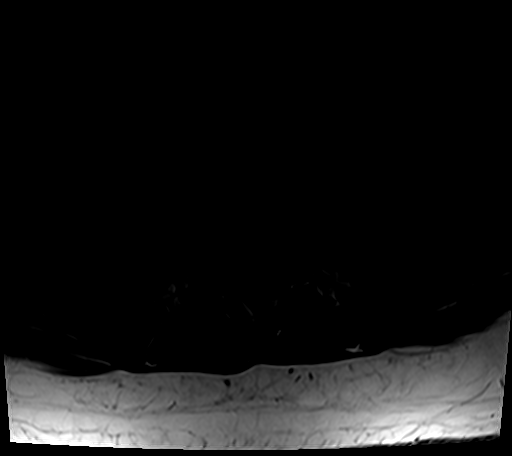
[im 8/37]
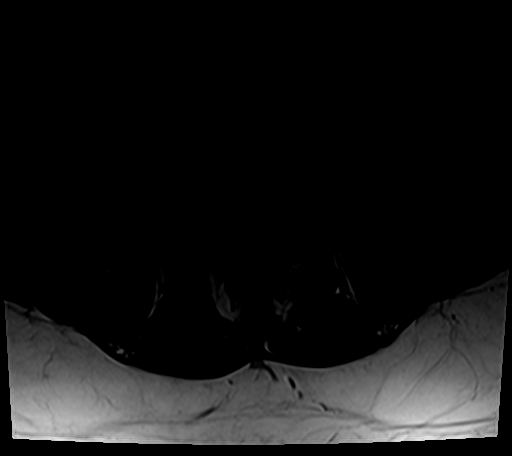
[im 11/37]
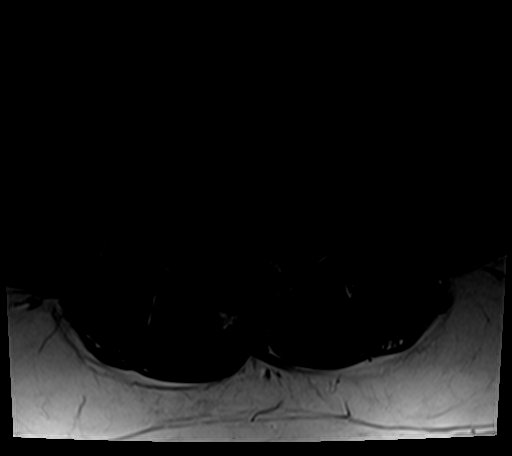
[im 15/37]
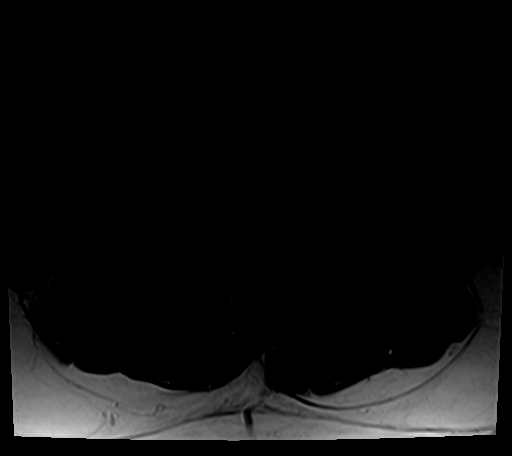
[im 19/37]
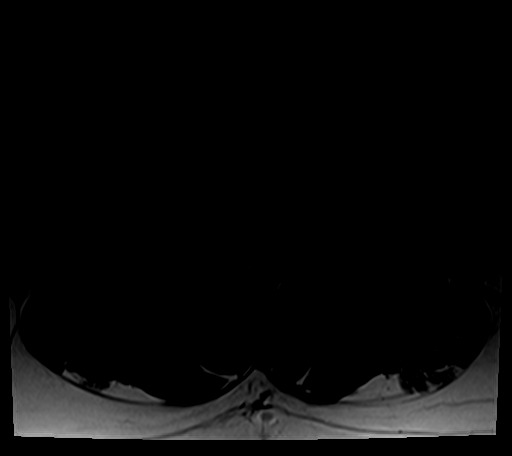
[im 22/37]
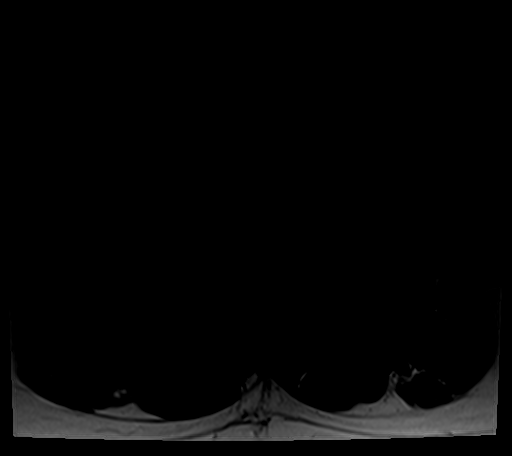
[im 26/37]
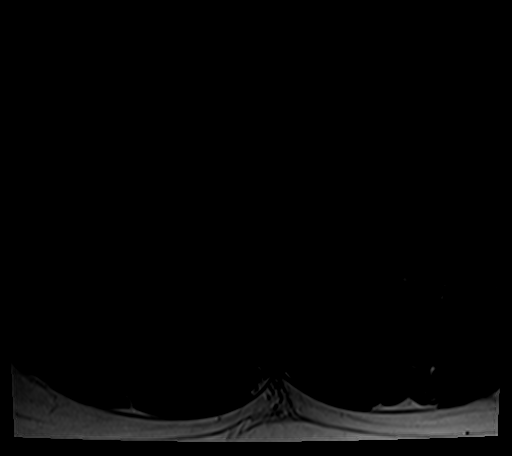
[im 29/37]
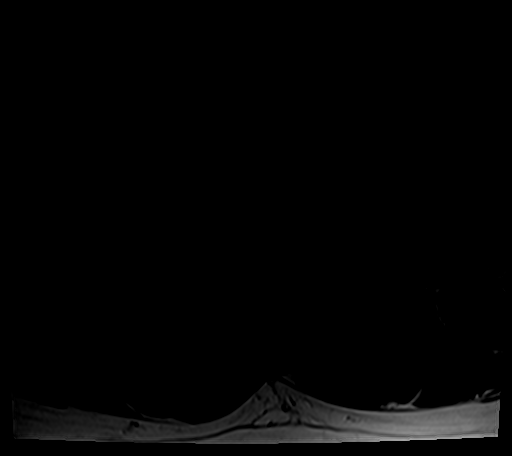
[im 33/37]
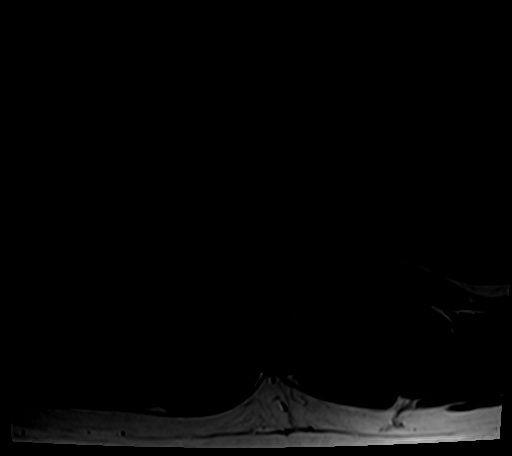

[Series 7: T2 · axial · 4.0mm · 0.74mm/px · z∈[-141,+86]mm · 11 of 37 slices shown]
[im 1/37]
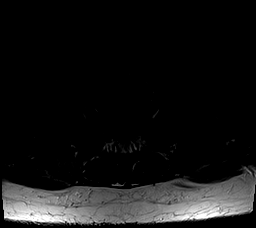
[im 4/37]
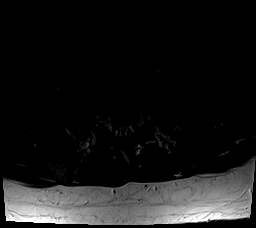
[im 8/37]
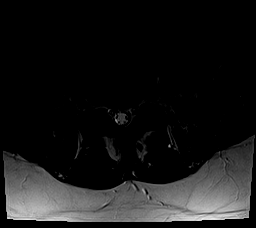
[im 11/37]
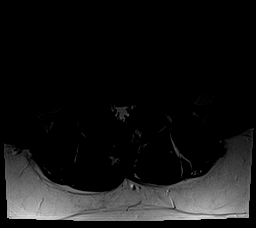
[im 15/37]
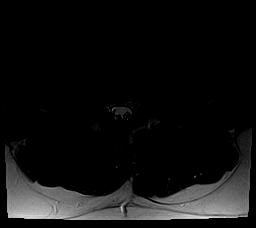
[im 19/37]
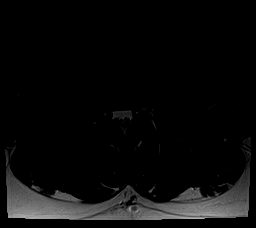
[im 22/37]
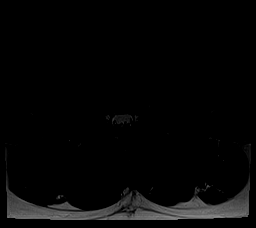
[im 26/37]
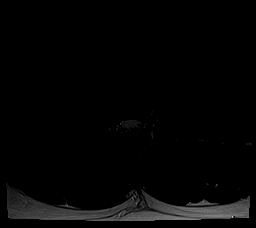
[im 29/37]
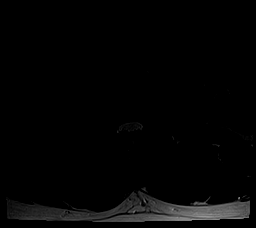
[im 33/37]
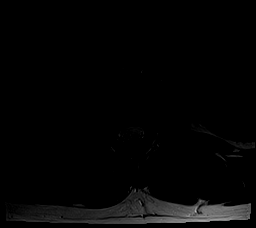
[im 37/37]
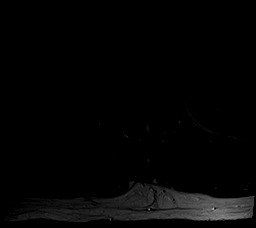

[28 of 48 positions shown; findings below may reference images not displayed]

FINDINGS: Segmentation:  Standard when compared to prior

Alignment:  Normal

Vertebrae:  No fracture, evidence of discitis, or bone lesion.

Conus medullaris and cauda equina: Conus extends to the L1 level.
Conus and cauda equina appear normal.

Paraspinal and other soft tissues: Negative

Disc levels:

T12- L1: Unremarkable.

L1-L2: Unremarkable.

L2-L3: Unremarkable.

L3-L4: Unremarkable.

L4-L5: Disc narrowing and desiccation with changes of left
paracentral microdiscectomy. Small residual herniation contacts the
left L5 nerve root which is mildly posteriorly displaced. Patent
foramina.

L5-S1:Disc narrowing with broad shallow protrusion not compressing
the S1 nerve roots. The foramina are patent
IMPRESSION: 1. Interval L4-5 left paracentral microdiscectomy. There is
significantly improved left subarticular recess patency with only
mild residual left L5 distortion due to disc material.
2. L5-S1 noncompressive disc degeneration.
3. No new abnormality.

## 2020-12-14 ENCOUNTER — Emergency Department (HOSPITAL_COMMUNITY)
Admission: EM | Admit: 2020-12-14 | Discharge: 2020-12-14 | Disposition: A | Discharge status: Home / Self Care | Payer: 59 | Attending: Emergency Medicine | Admitting: Emergency Medicine

## 2020-12-14 DIAGNOSIS — Z5321 Procedure and treatment not carried out due to patient leaving prior to being seen by health care provider: Secondary | ICD-10-CM | POA: Insufficient documentation

## 2020-12-14 DIAGNOSIS — M549 Dorsalgia, unspecified: Secondary | ICD-10-CM | POA: Insufficient documentation

## 2020-12-14 NOTE — ED Notes (Signed)
Pt decide to leave emergency department  °
